# Patient Record
Sex: Female | Born: 1980 | Race: White | Hispanic: No | Marital: Single | Smoking: Current every day smoker
Health system: Southern US, Community
[De-identification: ages and names within clinical notes are randomized; demographics above are authoritative.]

## PROBLEM LIST (undated history)

## (undated) DIAGNOSIS — L509 Urticaria, unspecified: Secondary | ICD-10-CM

## (undated) DIAGNOSIS — F419 Anxiety disorder, unspecified: Secondary | ICD-10-CM

## (undated) DIAGNOSIS — L4050 Arthropathic psoriasis, unspecified: Secondary | ICD-10-CM

## (undated) HISTORY — DX: Urticaria, unspecified: L50.9

## (undated) HISTORY — PX: CYST REMOVAL HAND: SHX6279

## (undated) HISTORY — DX: Arthropathic psoriasis, unspecified: L40.50

## (undated) HISTORY — PX: WISDOM TOOTH EXTRACTION: SHX21

---

## 2000-12-19 ENCOUNTER — Other Ambulatory Visit: Admission: RE | Admit: 2000-12-19 | Discharge: 2000-12-19 | Payer: Self-pay | Admitting: *Deleted

## 2000-12-20 ENCOUNTER — Encounter (INDEPENDENT_AMBULATORY_CARE_PROVIDER_SITE_OTHER): Payer: Self-pay | Admitting: Specialist

## 2000-12-20 ENCOUNTER — Other Ambulatory Visit: Admission: RE | Admit: 2000-12-20 | Discharge: 2000-12-20 | Payer: Self-pay | Admitting: *Deleted

## 2002-03-26 ENCOUNTER — Other Ambulatory Visit: Admission: RE | Admit: 2002-03-26 | Discharge: 2002-03-26 | Payer: Self-pay | Admitting: *Deleted

## 2002-05-24 ENCOUNTER — Encounter (INDEPENDENT_AMBULATORY_CARE_PROVIDER_SITE_OTHER): Payer: Self-pay | Admitting: Specialist

## 2002-05-24 ENCOUNTER — Ambulatory Visit (HOSPITAL_COMMUNITY): Admission: RE | Admit: 2002-05-24 | Discharge: 2002-05-24 | Payer: Self-pay | Admitting: *Deleted

## 2002-10-16 ENCOUNTER — Other Ambulatory Visit: Admission: RE | Admit: 2002-10-16 | Discharge: 2002-10-16 | Payer: Self-pay | Admitting: *Deleted

## 2003-01-07 ENCOUNTER — Other Ambulatory Visit: Admission: RE | Admit: 2003-01-07 | Discharge: 2003-01-07 | Payer: Self-pay | Admitting: *Deleted

## 2003-04-03 ENCOUNTER — Other Ambulatory Visit: Admission: RE | Admit: 2003-04-03 | Discharge: 2003-04-03 | Payer: Self-pay | Admitting: *Deleted

## 2003-09-16 ENCOUNTER — Other Ambulatory Visit: Admission: RE | Admit: 2003-09-16 | Discharge: 2003-09-16 | Payer: Self-pay | Admitting: *Deleted

## 2008-04-29 ENCOUNTER — Ambulatory Visit (HOSPITAL_COMMUNITY): Admission: RE | Admit: 2008-04-29 | Discharge: 2008-04-29 | Payer: Self-pay | Admitting: Pulmonary Disease

## 2009-05-11 ENCOUNTER — Ambulatory Visit (HOSPITAL_COMMUNITY): Payer: Self-pay | Admitting: Oncology

## 2009-05-11 ENCOUNTER — Encounter (HOSPITAL_COMMUNITY): Admission: RE | Admit: 2009-05-11 | Discharge: 2009-06-09 | Payer: Self-pay | Admitting: Oncology

## 2009-06-25 ENCOUNTER — Ambulatory Visit (HOSPITAL_COMMUNITY): Payer: Self-pay | Admitting: Oncology

## 2009-06-25 ENCOUNTER — Encounter (HOSPITAL_COMMUNITY): Admission: RE | Admit: 2009-06-25 | Discharge: 2009-07-25 | Payer: Self-pay | Admitting: Oncology

## 2009-06-29 ENCOUNTER — Ambulatory Visit (HOSPITAL_COMMUNITY): Payer: Self-pay | Admitting: Oncology

## 2010-01-21 ENCOUNTER — Ambulatory Visit (HOSPITAL_COMMUNITY): Payer: Self-pay | Admitting: Oncology

## 2010-01-21 ENCOUNTER — Encounter (HOSPITAL_COMMUNITY): Admission: RE | Admit: 2010-01-21 | Discharge: 2010-02-20 | Payer: Self-pay | Admitting: Oncology

## 2010-02-08 ENCOUNTER — Encounter (INDEPENDENT_AMBULATORY_CARE_PROVIDER_SITE_OTHER): Payer: Self-pay | Admitting: *Deleted

## 2010-02-08 DIAGNOSIS — F17200 Nicotine dependence, unspecified, uncomplicated: Secondary | ICD-10-CM | POA: Insufficient documentation

## 2010-02-08 DIAGNOSIS — J4489 Other specified chronic obstructive pulmonary disease: Secondary | ICD-10-CM | POA: Insufficient documentation

## 2010-02-08 DIAGNOSIS — F1910 Other psychoactive substance abuse, uncomplicated: Secondary | ICD-10-CM | POA: Insufficient documentation

## 2010-02-08 DIAGNOSIS — G2581 Restless legs syndrome: Secondary | ICD-10-CM | POA: Insufficient documentation

## 2010-02-08 DIAGNOSIS — E559 Vitamin D deficiency, unspecified: Secondary | ICD-10-CM | POA: Insufficient documentation

## 2010-02-08 DIAGNOSIS — D069 Carcinoma in situ of cervix, unspecified: Secondary | ICD-10-CM | POA: Insufficient documentation

## 2010-02-08 DIAGNOSIS — D72829 Elevated white blood cell count, unspecified: Secondary | ICD-10-CM | POA: Insufficient documentation

## 2010-02-08 DIAGNOSIS — J449 Chronic obstructive pulmonary disease, unspecified: Secondary | ICD-10-CM | POA: Insufficient documentation

## 2010-02-08 DIAGNOSIS — F329 Major depressive disorder, single episode, unspecified: Secondary | ICD-10-CM | POA: Insufficient documentation

## 2010-02-08 DIAGNOSIS — E669 Obesity, unspecified: Secondary | ICD-10-CM | POA: Insufficient documentation

## 2010-02-22 ENCOUNTER — Encounter (HOSPITAL_COMMUNITY): Admission: RE | Admit: 2010-02-22 | Discharge: 2010-03-12 | Payer: Self-pay | Admitting: Oncology

## 2010-03-01 ENCOUNTER — Ambulatory Visit: Payer: Self-pay | Admitting: Infectious Diseases

## 2010-03-01 LAB — CONVERTED CEMR LAB
Eosinophils Absolute: 0.2 10*3/uL (ref 0.0–0.7)
Eosinophils Relative: 1 % (ref 0–5)
HCT: 42.2 % (ref 36.0–46.0)
Hemoglobin: 13.9 g/dL (ref 12.0–15.0)
Lymphocytes Relative: 28 % (ref 12–46)
Lymphs Abs: 3.2 10*3/uL (ref 0.7–4.0)
MCV: 89.2 fL (ref 78.0–100.0)
Monocytes Absolute: 0.7 10*3/uL (ref 0.1–1.0)
Platelets: 329 10*3/uL (ref 150–400)
RDW: 13.1 % (ref 11.5–15.5)
WBC: 11.6 10*3/uL — ABNORMAL HIGH (ref 4.0–10.5)

## 2010-03-05 ENCOUNTER — Encounter: Payer: Self-pay | Admitting: Infectious Diseases

## 2010-03-24 ENCOUNTER — Ambulatory Visit: Payer: Self-pay | Admitting: Infectious Diseases

## 2010-03-31 ENCOUNTER — Encounter (HOSPITAL_COMMUNITY): Admission: RE | Admit: 2010-03-31 | Payer: Self-pay | Admitting: Oncology

## 2010-03-31 ENCOUNTER — Ambulatory Visit (HOSPITAL_COMMUNITY): Admission: RE | Admit: 2010-03-31 | Discharge: 2010-03-31 | Payer: Self-pay | Admitting: Infectious Diseases

## 2010-04-19 ENCOUNTER — Ambulatory Visit: Payer: Self-pay | Admitting: Infectious Diseases

## 2010-05-11 ENCOUNTER — Ambulatory Visit: Payer: Self-pay | Admitting: Oncology

## 2010-07-02 ENCOUNTER — Ambulatory Visit: Payer: Self-pay | Admitting: Oncology

## 2010-07-06 ENCOUNTER — Encounter: Payer: Self-pay | Admitting: Infectious Diseases

## 2010-07-06 LAB — COMPREHENSIVE METABOLIC PANEL
AST: 17 U/L (ref 0–37)
Albumin: 4.2 g/dL (ref 3.5–5.2)
Alkaline Phosphatase: 76 U/L (ref 39–117)
Glucose, Bld: 87 mg/dL (ref 70–99)
Potassium: 4.4 mEq/L (ref 3.5–5.3)
Sodium: 139 mEq/L (ref 135–145)
Total Bilirubin: 0.2 mg/dL — ABNORMAL LOW (ref 0.3–1.2)
Total Protein: 6.6 g/dL (ref 6.0–8.3)

## 2010-07-06 LAB — CBC WITH DIFFERENTIAL/PLATELET
BASO%: 1 % (ref 0.0–2.0)
EOS%: 0.6 % (ref 0.0–7.0)
MCH: 29.4 pg (ref 25.1–34.0)
MCHC: 33.6 g/dL (ref 31.5–36.0)
MONO#: 0.7 10*3/uL (ref 0.1–0.9)
NEUT%: 68.2 % (ref 38.4–76.8)
RBC: 4.54 10*6/uL (ref 3.70–5.45)
RDW: 13 % (ref 11.2–14.5)
WBC: 10.1 10*3/uL (ref 3.9–10.3)
lymph#: 2.4 10*3/uL (ref 0.9–3.3)

## 2010-07-06 LAB — CHCC SMEAR

## 2010-07-09 LAB — JAK-2 V617F

## 2010-07-13 NOTE — Miscellaneous (Signed)
Summary: HIPAA Restrictions  HIPAA Restrictions   Imported By: Florinda Marker 03/01/2010 14:45:06  _____________________________________________________________________  External Attachment:    Type:   Image     Comment:   External Document

## 2010-07-13 NOTE — Assessment & Plan Note (Signed)
Summary: 3wk f/u/ok per hatcher/vs   CC:  3 week follow up.  History of Present Illness: 30 yo F obesity, HPV of the cervix, and leukocytosis since August 2010. She has been eval by heme. No specific etiology was found and her WBC has continued to be elavated.  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 Also has dog. Father with TB.  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 LAP and LAD studies, brucella Ab-.  Today c/o sinus fullness, d/c, cough, no headache. No fevers since first 8 days of signs. lymphadenopathy.  Preventive Screening-Counseling & Management  Alcohol-Tobacco     Alcohol drinks/day: 0     Smoking Status: current     Packs/Day: 0.5  Caffeine-Diet-Exercise     Caffeine use/day: sodas     Does Patient Exercise: yes     Type of exercise: water aerobics  Safety-Violence-Falls     Seat Belt Use: yes   Updated Prior Medication List: VITAMIN D3 1000 UNIT TABS (CHOLECALCIFEROL) Take 1 tablet by mouth two times a day per PCP ANTACID 420 MG CHEW (CALCIUM CARBONATE ANTACID) Take 1 tablet by mouth once a day per PCP ORTHO TRI-CYCLEN LO 0.18/0.215/0.25 MG-25 MCG TABS (NORGESTIM-ETH ESTRAD TRIPHASIC) as directed per PCP ONE-A-DAY WOMENS PRENATAL 28-0.8 & 440 MG MISC (PRENATAL VIT-FE FUM-FA-OMEGA) Take 1 tablet by mouth once a day per PCP  Current Allergies (reviewed today): No known allergies  Past History:  Past medical, surgical, family and social histories (including risk factors) reviewed, and no changes noted (except as noted below).  Past Medical History: Reviewed history from 03/01/2010 and no changes required. 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 father with TB, both parents with IBS.   Social History: Reviewed history from 03/01/2010 and no changes required. Domestic Partner Current Smoker-  1/2 ppd for 11 yrs. has quit occasionally.  Alcohol use-no  Vital Signs:  Patient profile:   31 year old female Height:      62 inches (157.48 cm) Weight:      186.0 pounds (84.55 kg) BMI:     34.14 Temp:     97.8 degrees F (36.56 degrees C) oral Pulse rate:   101 / minute BP sitting:   123 / 81  (right arm)  Vitals Entered By: Baxter Hire) (March 24, 2010 8:59 AM) CC: 3 week follow up Pain Assessment Patient in pain? no      Nutritional Status BMI of > 30 = obese Nutritional Status Detail appetite is okay per patient  Does patient need assistance? Functional Status Self care Ambulation Normal   Physical Exam  General:  well-developed, well-nourished, and well-hydrated.   Eyes:  pupils equal, pupils round, and pupils reactive to light.   Mouth:  pharynx pink and moist and no exudates.   Neck:  no masses.   Lungs:  normal respiratory effort and normal breath sounds.   Heart:  normal rate, regular rhythm, and no murmur.   Abdomen:  soft, non-tender, and normal bowel sounds.     Impression & Recommendations:  Problem # 1:  LEUKOCYTOSIS (ICD-288.60)  her w/u to date has been negative. will send her for CT chest/abd/pelvis to further eval. if this is negative, will send her to heme for BMBx.  return to  clinic 3 weeks.  Orders: Est. Patient Level III (16109) CT with Contrast (CT w/ contrast)

## 2010-07-13 NOTE — Consult Note (Signed)
Summary: [facility]  [facility]   Imported By: Florinda Marker 03/04/2010 16:18:03  _____________________________________________________________________  External Attachment:    Type:   Image     Comment:   External Document

## 2010-07-13 NOTE — Assessment & Plan Note (Signed)
Summary: 3WK F/U[MKJ]   CC:  f/u .  History of Present Illness: 30 yo F obesity, HPV of the cervix, and leukocytosis since August 2010. She has been eval by heme. No specific etiology was found and her WBC has continued to be elavated.  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 Also has dog. Father with TB.  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 LAP and LAD studies, brucella Ab-. Has had CT chest/abd/pelvis negative since last visit. Had some problems with dizziness with bending over recently. Would break out in a sweat with this as well. It has stopped over the last week since she resumed taking her VIt D pills.   Preventive Screening-Counseling & Management  Alcohol-Tobacco     Alcohol drinks/day: 0     Smoking Status: current     Packs/Day: 0.5   Updated Prior Medication List: VITAMIN D3 1000 UNIT TABS (CHOLECALCIFEROL) 2 tab at hs ORTHO TRI-CYCLEN LO 0.18/0.215/0.25 MG-25 MCG TABS (NORGESTIM-ETH ESTRAD TRIPHASIC) as directed per PCP ONE-A-DAY WOMENS PRENATAL 28-0.8 & 440 MG MISC (PRENATAL VIT-FE FUM-FA-OMEGA) Take 1 tablet by mouth once a day per PCP  Current Allergies (reviewed today): No known allergies  Past History:  Past medical, surgical, family and social histories (including risk factors) reviewed, and no changes noted (except as noted below).  Past Medical History: Reviewed history from 03/01/2010 and no changes required. 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 father with TB, both parents with IBS.   Social History: Reviewed history from 03/01/2010 and no changes required. Domestic Partner Current Smoker-  1/2 ppd for 11  yrs. has quit occasionally.  Alcohol use-no  Vital Signs:  Patient profile:   30 year old female Height:      62 inches (157.48 cm) Weight:      184.50 pounds (83.86 kg) BMI:     33.87 Temp:     98.0 degrees F (36.67 degrees C) oral Pulse rate:   102 / minute  Vitals Entered By: Starleen Arms CMA (April 19, 2010 9:28 AM) CC: f/u  Is Patient Diabetic? No Pain Assessment Patient in pain? no      Nutritional Status BMI of > 30 = obese Nutritional Status Detail nl  Does patient need assistance? Functional Status Self care Ambulation Normal   Physical Exam  General:  well-developed, well-nourished, well-hydrated, and overweight-appearing.   Eyes:  pupils equal, pupils round, and pupils reactive to light.   Mouth:  pharynx pink and moist and no exudates.   Neck:  no masses.   Lungs:  normal respiratory effort and normal breath sounds.   Heart:  normal rate, regular rhythm, and no murmur.   Abdomen:  soft, non-tender, and normal bowel sounds.     Impression & Recommendations:  Problem # 1:  LEUKOCYTOSIS (ICD-288.60)  her dx continues to be ellusive. we spoke about next steps for her. Considerations are for an indium study or referal to heme. we agreed on a heme eval, hopefillu for a bone marrow biopsy. she will return to clinic as needed   Orders: Hematology Referral (Hematology) Est. Patient Level IV (36644)  Problem # 2:  OBESITY (ICD-278.00) she has lost 1# since last visit. she is trying to loose more, she is encouraged in this.  Orders: Est. Patient Level IV (16109)  Medications Added to Medication List This Visit: 1)  Vitamin D3 1000 Unit Tabs (Cholecalciferol) .... 2 tab at hs

## 2010-07-13 NOTE — Assessment & Plan Note (Signed)
Summary: new pt  elevated wht ct   CC:  new patient - elevated wht ct.  History of Present Illness: 30 yo F obesity, HPV of the cervix, and leukocytosis (13,700) since August 2010. She has been eval by heme. No specific etiology was found and her WBC has continued to be elavated [15.6 (01-21-10)].  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 No fever or chills. States that she does get hot at night time and takes off sheets. has been having sweats as well. feels like she is always cold. wakes up with hair wet.   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 Also has dog. Father with TB.   Preventive Screening-Counseling & Management  Alcohol-Tobacco     Alcohol drinks/day: 0     Smoking Status: current     Packs/Day: 0.5  Caffeine-Diet-Exercise     Caffeine use/day: sodas  Safety-Violence-Falls     Seat Belt Use: yes   Updated Prior Medication List: VITAMIN D3 1000 UNIT TABS (CHOLECALCIFEROL) Take 1 tablet by mouth two times a day per PCP ANTACID 420 MG CHEW (CALCIUM CARBONATE ANTACID) Take 1 tablet by mouth once a day per PCP ORTHO TRI-CYCLEN LO 0.18/0.215/0.25 MG-25 MCG TABS (NORGESTIM-ETH ESTRAD TRIPHASIC) as directed per PCP ONE-A-DAY WOMENS PRENATAL 28-0.8 & 440 MG MISC (PRENATAL VIT-FE FUM-FA-OMEGA) Take 1 tablet by mouth once a day per PCP  Current Allergies (reviewed today): No known allergies  Past History:  Past Medical History: Current Problems:  RESTLESS LEG SYNDROME (ICD-333.94) UNSPECIFIED VITAMIN D DEFICIENCY (ICD-268.9) COPD (ICD-496) DEPRESSION, MILD (ICD-311) CAFFEINE EXCESS (ICD-305.90) OBESITY (ICD-278.00) CARCINOMA IN SITU, 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   Social History: Media planner Current Smoker-  1/2 ppd for 11 yrs. has quit occasionally.  Alcohol use-no  Review of Systems       BM 4x/day, believes she has IBS, passes urine  well, normal periods. wt is steady for last year. have been doing water aerobics twice a week. no lymphadenopathy, has had bilateral LE cramping for years.   Vital Signs:  Patient profile:   30 year old female Height:      62 inches (157.48 cm) Weight:      188.0 pounds (85.45 kg) BMI:     34.51 Temp:     98.6 degrees F (37.00 degrees C) oral Pulse rate:   97 / minute BP sitting:   119 / 78  (right arm)  Vitals Entered By: Baxter Hire) (March 01, 2010 11:19 AM) CC: new patient - elevated wht ct Pain Assessment Patient in pain? no      Nutritional Status BMI of > 30 = obese Nutritional Status Detail appetite is fine per patient  Does patient need assistance? Functional Status Self care Ambulation Normal   Physical Exam  General:  well-developed, well-nourished, and well-hydrated.   Eyes:  pupils equal, pupils round, and pupils reactive to light.   Mouth:  pharynx pink and moist and no exudates.   Neck:  no masses.   Lungs:  normal respiratory effort and normal breath sounds.   Heart:  normal rate, regular rhythm, and no murmur.   Abdomen:  soft, non-tender, and normal bowel sounds.   Extremities:  no edema. Neurologic:  alert & oriented X3, cranial nerves II-XII intact, and strength normal in all extremities.   Skin:  few scared areas where she has had "skin cancers" burned off.  Axillary Nodes:  no R axillary adenopathy and no L axillary adenopathy.     Impression & Recommendations:  Problem # 1:  LEUKOCYTOSIS (ICD-288.60)  Wide ddx in this pt- from non-infetious (smoking, OCPs), infectious (? brucelosis), immunologic (LAD),  hematologic. WIll check brucella Ab, leukocyte alkaline phosphatse score, flow cytometry (for LAD). If these are negative will ocnsider indium scan.  return to clinic 2-3 weeks.   Orders: Consultation Level IV [phone]) T- * Misc. Laboratory test [phone]) T-CBC w/Diff [phone])  Appended Document: Orders Update    Clinical  Lists Changes  Orders: Added new Test order of T- * Misc. Laboratory test [phone]) - Signed

## 2010-07-13 NOTE — Miscellaneous (Signed)
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) - HPV positivity in 2000, several surgical loop excisions of her cervix Added new problem of OBESITY (ICD-278.00) Added new problem of CAFFEINE EXCESS (ICD-305.90) Added new problem of DEPRESSION, MILD (ICD-311) - hx of Added new problem of COPD (ICD-496) Added new problem of UNSPECIFIED VITAMIN D DEFICIENCY (ICD-268.9) - August 2010 diagnosed Added new problem of RESTLESS LEG SYNDROME (ICD-333.94) - on Limbitrol Medications: Added new medication of VITAMIN D3 1000 UNIT TABS (CHOLECALCIFEROL) Take 1 tablet by mouth two times a day per PCP Added new medication of ANTACID 420 MG CHEW (CALCIUM CARBONATE ANTACID) Take 1 tablet by mouth once a day per PCP Added new medication of ORTHO TRI-CYCLEN LO 0.18/0.215/0.25 MG-25 MCG TABS (NORGESTIM-ETH ESTRAD TRIPHASIC) as directed per PCP Added new medication of ONE-A-DAY WOMENS PRENATAL 28-0.8 & 440 MG MISC (PRENATAL VIT-FE FUM-FA-OMEGA) Take 1 tablet by mouth once a day per PCP Observations: Added new observation of NKA: T (02/08/2010 16:27)

## 2010-08-10 NOTE — Consult Note (Signed)
Summary: [facility] New Pt. Eval  [facility] New Pt. Eval   Imported By: Florinda Marker 08/06/2010 11:42:28  _____________________________________________________________________  External Attachment:    Type:   Image     Comment:   External Document

## 2010-08-18 ENCOUNTER — Encounter: Payer: Self-pay | Admitting: *Deleted

## 2010-08-26 LAB — CBC
HCT: 40.7 % (ref 36.0–46.0)
MCHC: 34 g/dL (ref 30.0–36.0)
MCV: 88.3 fL (ref 78.0–100.0)
Platelets: 258 10*3/uL (ref 150–400)
RDW: 12.9 % (ref 11.5–15.5)

## 2010-08-26 LAB — DIFFERENTIAL
Basophils Absolute: 0.1 10*3/uL (ref 0.0–0.1)
Basophils Relative: 1 % (ref 0–1)
Eosinophils Absolute: 0.2 10*3/uL (ref 0.0–0.7)
Eosinophils Relative: 2 % (ref 0–5)
Monocytes Absolute: 0.7 10*3/uL (ref 0.1–1.0)

## 2010-08-26 LAB — SEDIMENTATION RATE: Sed Rate: 26 mm/hr — ABNORMAL HIGH (ref 0–22)

## 2010-08-27 LAB — CBC
Hemoglobin: 13 g/dL (ref 12.0–15.0)
MCH: 29.8 pg (ref 26.0–34.0)
MCHC: 33.6 g/dL (ref 30.0–36.0)
Platelets: 292 10*3/uL (ref 150–400)
RDW: 13 % (ref 11.5–15.5)

## 2010-08-27 LAB — DIFFERENTIAL
Basophils Absolute: 0 10*3/uL (ref 0.0–0.1)
Basophils Relative: 0 % (ref 0–1)
Eosinophils Absolute: 0.2 10*3/uL (ref 0.0–0.7)
Monocytes Relative: 8 % (ref 3–12)
Neutrophils Relative %: 57 % (ref 43–77)

## 2010-08-29 LAB — DIFFERENTIAL
Basophils Absolute: 0.1 10*3/uL (ref 0.0–0.1)
Basophils Relative: 1 % (ref 0–1)
Lymphocytes Relative: 27 % (ref 12–46)
Monocytes Relative: 6 % (ref 3–12)
Neutro Abs: 8.7 10*3/uL — ABNORMAL HIGH (ref 1.7–7.7)
Neutrophils Relative %: 65 % (ref 43–77)

## 2010-08-29 LAB — CBC
MCHC: 33.5 g/dL (ref 30.0–36.0)
RBC: 4.55 MIL/uL (ref 3.87–5.11)

## 2010-09-15 LAB — DIFFERENTIAL
Basophils Relative: 1 % (ref 0–1)
Eosinophils Absolute: 0.2 10*3/uL (ref 0.0–0.7)
Neutrophils Relative %: 66 % (ref 43–77)

## 2010-09-15 LAB — COMPREHENSIVE METABOLIC PANEL
ALT: 22 U/L (ref 0–35)
Alkaline Phosphatase: 81 U/L (ref 39–117)
CO2: 30 mEq/L (ref 19–32)
GFR calc non Af Amer: >60 mL/min (ref >60)
Glucose, Bld: 104 mg/dL — ABNORMAL HIGH (ref 70–99)
Potassium: 3.7 mEq/L (ref 3.5–5.1)
Sodium: 138 mEq/L (ref 135–145)

## 2010-09-15 LAB — CBC
Hemoglobin: 13.5 g/dL (ref 12.0–15.0)
MCHC: 34.6 g/dL (ref 30.0–36.0)
RBC: 4.44 MIL/uL (ref 3.87–5.11)

## 2010-09-15 LAB — IRON AND TIBC
Iron: 43 ug/dL (ref 42–135)
TIBC: 314 ug/dL (ref 250–470)

## 2010-10-05 ENCOUNTER — Encounter (HOSPITAL_BASED_OUTPATIENT_CLINIC_OR_DEPARTMENT_OTHER): Payer: 59 | Admitting: Oncology

## 2010-10-05 ENCOUNTER — Other Ambulatory Visit: Payer: Self-pay | Admitting: Oncology

## 2010-10-05 DIAGNOSIS — D72829 Elevated white blood cell count, unspecified: Secondary | ICD-10-CM

## 2010-10-05 LAB — CBC WITH DIFFERENTIAL/PLATELET
Basophils Absolute: 0.1 10*3/uL (ref 0.0–0.1)
Eosinophils Absolute: 0.1 10*3/uL (ref 0.0–0.5)
HGB: 13.9 g/dL (ref 11.6–15.9)
MCV: 88.6 fL (ref 79.5–101.0)
MONO%: 8 % (ref 0.0–14.0)
NEUT#: 6.9 10*3/uL — ABNORMAL HIGH (ref 1.5–6.5)
Platelets: 278 10*3/uL (ref 145–400)
RDW: 12.9 % (ref 11.2–14.5)

## 2010-10-29 NOTE — Op Note (Signed)
NAME:  Connie Jensen, Connie Jensen                        ACCOUNT NO.:  [id number]   MEDICAL RECORD NO.:  [id number]                   PATIENT TYPE:  AMB   LOCATION:  DAY                                  FACILITY:  [facility]   PHYSICIAN:  Pershing Cox, M.D.            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   PROCEDURE:  1. Colposcopic directed biopsies of the vulva x 3.  2. Laser conization.   ANESTHESIA:  General endotracheal via LMA mask and paracervical block.   SURGEON:  Pershing Cox, M.D.   INDICATIONS FOR PROCEDURE:  This patient is 30 years old.  She has a history  of cervical dysplasia and underwent loop conization in August 2002.  This  loop specimen showed mild dysplasia focally involving the endocervical  margin on the anterior half of the cone.  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  Colposcopic examination showed white epithelium extending into  the endocervical canal.  There was an area of white epithelium encircling  the exocervix as well.  At the time of this colposcopy, she was found to  have lesions on her vulva.  She was not willing to have a biopsy of her  vulva, and so we made a plan to come to the operating room to do vulvar  biopsies, frozen section on the biopsy, and then wide excision as necessary.  In addition, I recommended a laser conization because of her poor tolerance  of the LEEP procedure in the office.  In discussing our plans with the  pathologist this morning, there was grave concern that we might over-call or  under-call the vulvar biopsies  by frozen section, and so a decision was made  to do vulvar biopsies and the cone, deferring any therapy of the vulvar  lesions to the results of these biopsies.  I discussed this with the patient  prior to surgery, and she was agreeable.   OPERATIVE FINDINGS:  On colposcopic examination of the vulva, there was a  discrete white area on the left lower labia majora.  There was a broad white  area on the perineal body that was biopsied twice.  The cone was as  described above.   DESCRIPTION OF PROCEDURE:  [name] was brought to the operating room  with an IV in place.  She received 1 g of Ancef in the holding area.  Supine  on the OR table, IV sedation was administered.  She then underwent the  placement of LMA for general anesthesia.  The colposcope was used to  visualize her  vulva.  Acetic acid was applied for approximately two minutes.  The areas of white epithelium were easily noted.  Using a 3 mm dermal punch,  these areas were biopsied once on the left lower labia majora and twice on  the perineal body.  The cautery was used to obtain hemostasis at the base of  these biopsies.   The patient's cervix was visualized using a blackened speculum.  This was  attached to suction for our floundering the procedure.  The laser was set on  #9 super-pulse and under direct visualization, the cone was outlined using  intermittent dot system.  Then a dilute vasopressin solution 10 in 100 was  injected into the paracervical tissues using a total volume of 20 cc of this  solution.  It was well-tolerated without hypertension.  Again, the laser set  on a setting of 9 super-pulse was used to connect the dots, and then the  laser was used to perform a cone biopsy.  A very small, narrow cone was  performed using the blackened instruments until the base of the cone was  reached.  At this point, an Allis clamp was placed, and the base of the cone  was brought to the endocervix.  Once the endocervix  was reached, sharp  scissors were used to excise the base of the cone, and the cone was marked  at the 12 o'clock position.  Next, endocervical curette was used to curette  the upper endocervical canal, and this was collected onto Telfa.  The laser  was then completely defocused and using the same setting, the base of the  cone was lasered to coagulate any potential bleeders.  Monsel's was applied  to the cone.  The patient was extubated and taken to the recovery room in  good condition.  Specimens include vulvar biopsy in the perineum, two  biopsies left lower labia majora, one biopsy laser cone, and endocervical  curettings.   It should be noted that throughout this procedure, the patient's cervix was  manipulated consistently by using blackened instruments to rotate the cervix  and allow me to make the smallest possible cone.  Halfway through the  procedure, we replaced the blackened speculum with a weighted vaginal  speculum and the anterior blackened retractor so that I could move around  more easily with the small hooks and diverting devices.                                               Pershing Cox, M.D.    MAJ/MEDQ  D:  05/24/2002  T:  05/24/2002  Job:  [id number]

## 2010-10-29 NOTE — Procedures (Signed)
Connie Jensen, Connie Jensen              ACCOUNT NO.:  [id number]   MEDICAL RECORD NO.:  [id number]          PATIENT TYPE:  OUT   LOCATION:  RESP                          FACILITY:  [facility]   PHYSICIAN:  Edward L. Juanetta Gosling, M.D.DATE OF BIRTH:  1980/10/06   DATE OF PROCEDURE:  DATE OF DISCHARGE:  04/29/2008                            PULMONARY FUNCTION TEST   1. Spirometry shows no ventilatory defect and no definite airflow      obstruction.  2. Lung volumes are normal.  3. DLCO is normal.  4. There is no definite cause of shortness of breath seen on this      study, although all of the numbers are about 80% of predicted,      which is the lower limit of normal.      Edward L. Juanetta Gosling, M.D.  Electronically Signed     ELH/MEDQ  D:  05/02/2008  T:  05/02/2008  Job:  [id number]

## 2011-01-14 IMAGING — CR DG CHEST 2V
2 series · 2 of 2 positions shown · non-contrast
Comparison: None.

CLINICAL DATA: Leukocytosis, smoker.

CHEST - 2 VIEW

[view not recorded (1 of 2)]
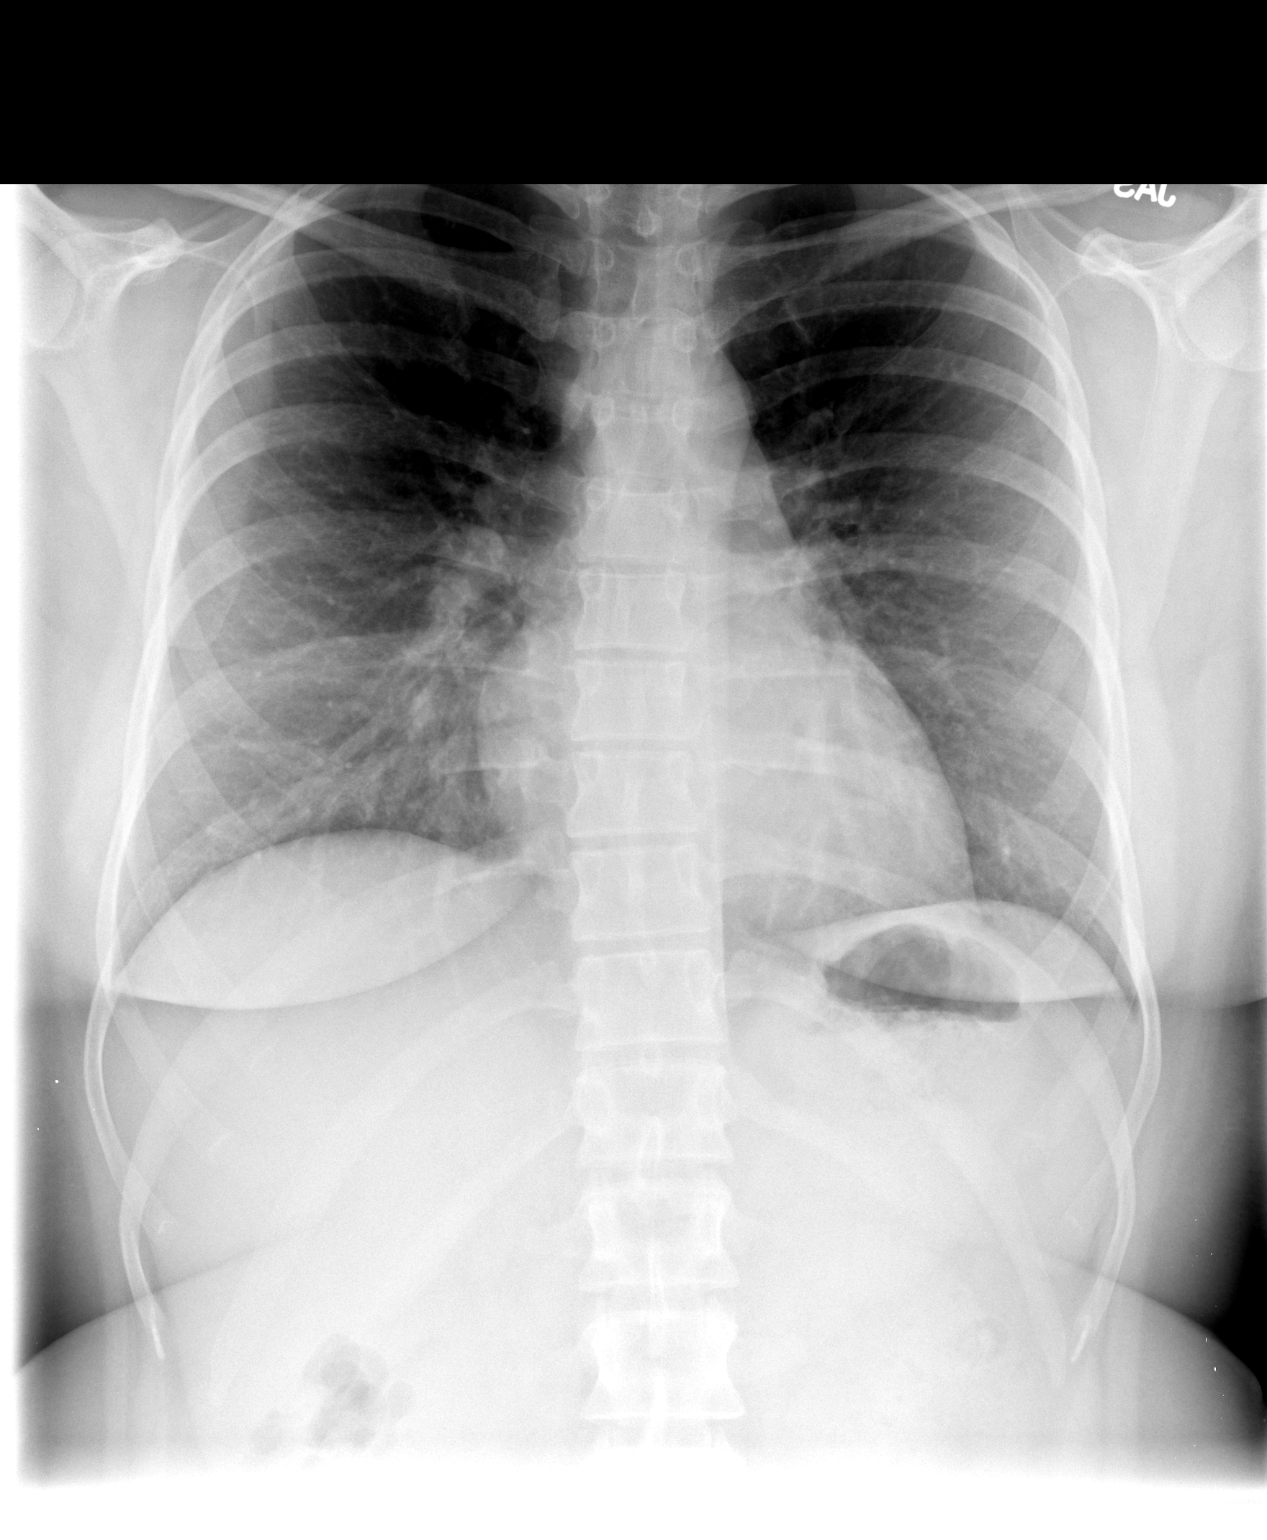

[view not recorded (2 of 2)]
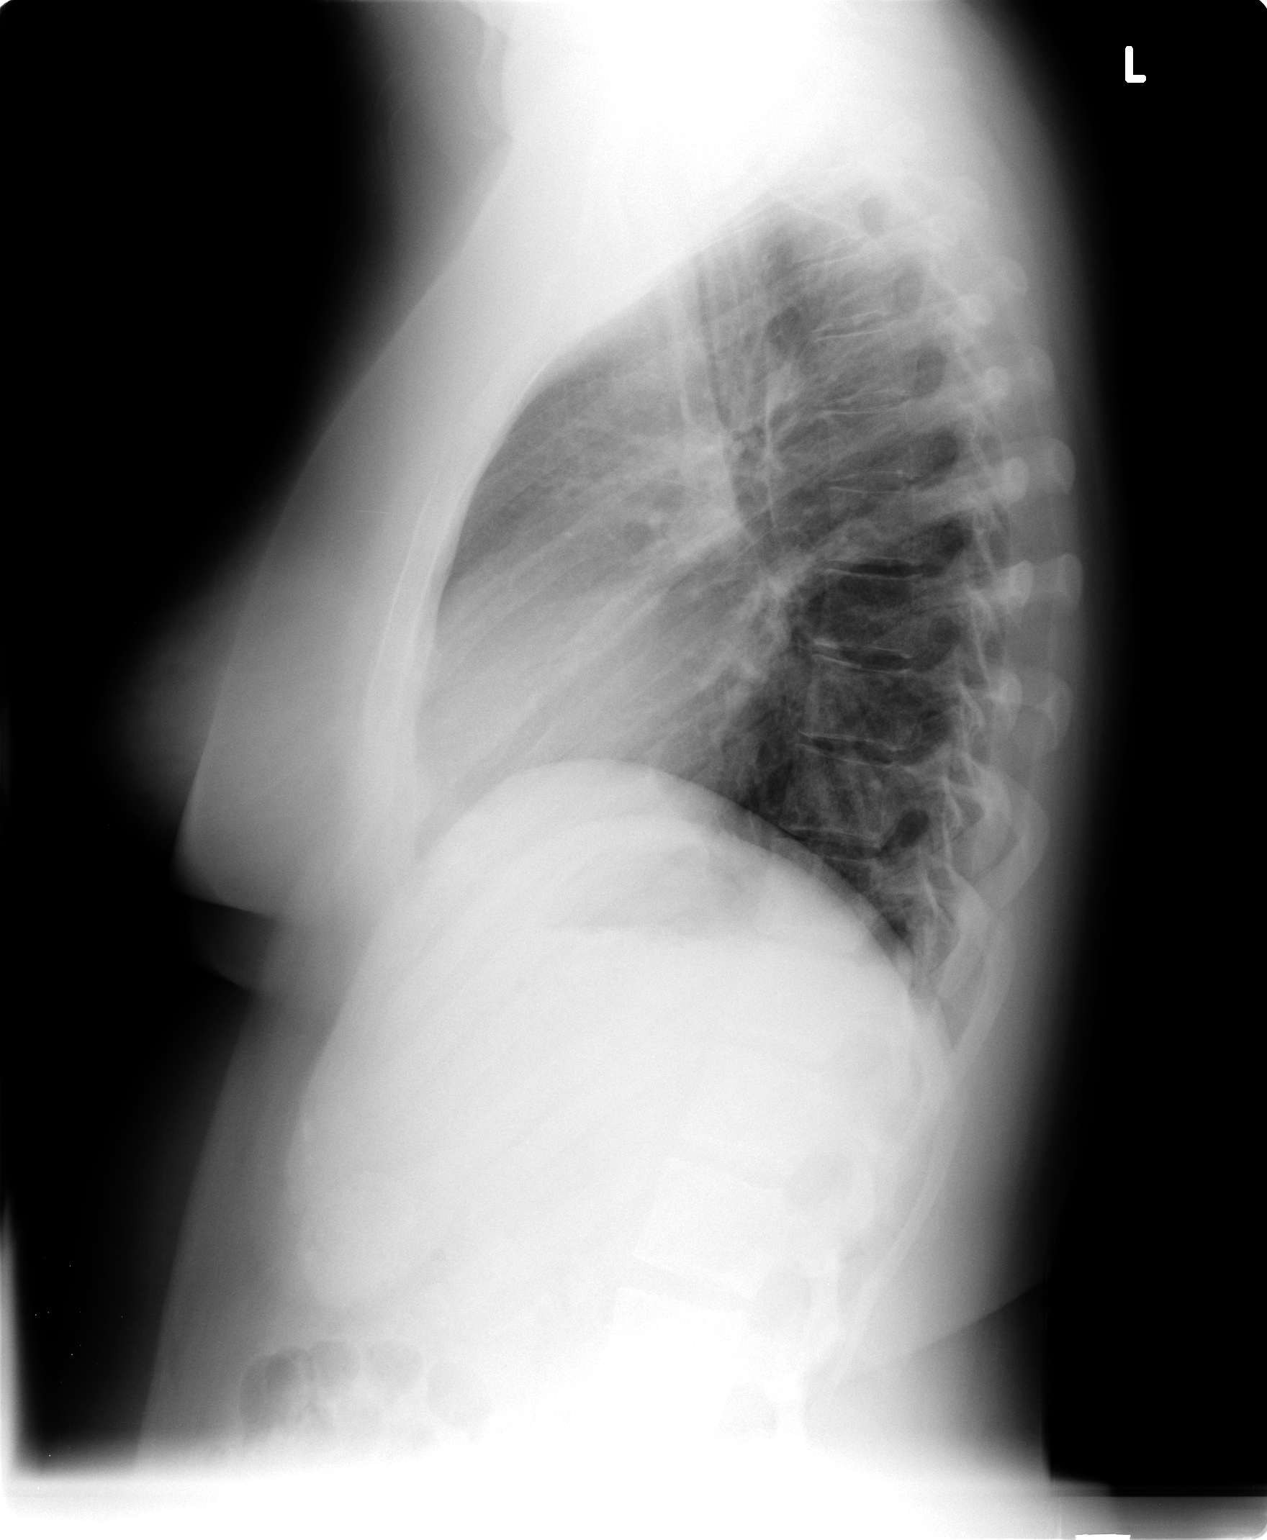

[2 of 2 positions shown; findings below may reference images not displayed]

FINDINGS: Trachea is midline.  Heart size normal.  Lungs are clear.
No pleural fluid.
IMPRESSION: No acute findings.

## 2011-01-27 ENCOUNTER — Encounter: Payer: Self-pay | Admitting: Orthopedic Surgery

## 2011-01-27 ENCOUNTER — Ambulatory Visit (INDEPENDENT_AMBULATORY_CARE_PROVIDER_SITE_OTHER): Payer: 59 | Admitting: Orthopedic Surgery

## 2011-01-27 VITALS — HR 108 | Ht 62.0 in | Wt 194.0 lb

## 2011-01-27 DIAGNOSIS — M549 Dorsalgia, unspecified: Secondary | ICD-10-CM

## 2011-01-27 DIAGNOSIS — S76219A Strain of adductor muscle, fascia and tendon of unspecified thigh, initial encounter: Secondary | ICD-10-CM | POA: Insufficient documentation

## 2011-01-27 DIAGNOSIS — IMO0002 Reserved for concepts with insufficient information to code with codable children: Secondary | ICD-10-CM

## 2011-01-27 MED ORDER — IBUPROFEN 800 MG PO TABS: 800.0000 mg | ORAL_TABLET | Freq: Three times a day (TID) | ORAL | Status: AC | PRN

## 2011-01-27 NOTE — Progress Notes (Signed)
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  Approximately 3 weeks ago the patient reached over to pick up an empty garbage can and start having left-sided lower back pain.  She reports catching in the lower back with occasional tingling in the LEFT calf but denies any bowel or bladder dysfunction.  She was placed on Cipro to treat for possible kidney infection  The pain in her groin is rated 4/10 pain in her back is rated 8/10 at its described as throbbing intermittent.  She says it is worse when she is moving her fingers making it better.    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  She has no major medical problems she had surgery in 2004 (loop)  PFSH: (1) Stated above  Physical Exam(12) GENERAL: normal development   CDV: pulses are normal   Skin: normal  Lymph: nodes were not palpable/normal  Psychiatric: awake, alert and oriented  Neuro: normal sensation  MSK She ambulates without assistive device  She has tenderness in the LEFT quadrant of her lower back no pain in the midline or pain on the RIGHT she flexes to mid tibia level and has pain with extension she has a negative straight leg raise bilaterally.   1As far as the groin goes she has pain with abduction pain with internal and external rotation no pain with flexion. 2 Muscle tone is normal 3 Range of motion is full 4 Hip is stable bilaterally 5 There is no tenderness to the thigh but tenderness in the  adductors  Assessment:   #1 she has extreme groin  #2 she has a recent onset lower back pain  I placed her in therapy added ibuprofen continue her Vicodin and Robaxin.  When she comes back in 4 weeks if she still having back pain recommend x-ray.

## 2011-02-24 ENCOUNTER — Ambulatory Visit: Payer: 59 | Admitting: Orthopedic Surgery

## 2011-08-17 ENCOUNTER — Encounter (INDEPENDENT_AMBULATORY_CARE_PROVIDER_SITE_OTHER): Payer: Self-pay | Admitting: *Deleted

## 2011-08-22 ENCOUNTER — Ambulatory Visit (INDEPENDENT_AMBULATORY_CARE_PROVIDER_SITE_OTHER): Payer: BC Managed Care – PPO | Admitting: Internal Medicine

## 2011-08-22 ENCOUNTER — Encounter (INDEPENDENT_AMBULATORY_CARE_PROVIDER_SITE_OTHER): Payer: Self-pay | Admitting: Internal Medicine

## 2011-08-22 VITALS — BP 132/70 | HR 72 | Temp 98.7°F | Ht 62.0 in | Wt 193.7 lb

## 2011-08-22 DIAGNOSIS — K589 Irritable bowel syndrome without diarrhea: Secondary | ICD-10-CM

## 2011-08-22 MED ORDER — DICYCLOMINE HCL 10 MG PO CAPS: 10.0000 mg | ORAL_CAPSULE | Freq: Four times a day (QID) | ORAL | Status: DC

## 2011-08-22 NOTE — Patient Instructions (Signed)
Bentyl 10mg  QID. PR in 2 weeks. Stool diary. OV in 3 months

## 2011-08-22 NOTE — Progress Notes (Signed)
Subjective:     Patient ID: Connie Jensen, female   DOB: 05-24-1981, 31 y.o.   MRN: [id number]  HPI Connie Jensen is a 31 yr old female referred to our office by Dr. Margo Aye  She tells me she has IBS x 10 yrs. She says she has urgency. She has 4-6 BMS a day.  Stools are very watery. She will have one good, formed stool in the am and the rest will be loose. She tells me her mother and father have the same symptoms. Appetite is good. Weight loss intentional.  She will have abdominal crampy feeling when she needs to have a BM. No melena or bright red rectal bleeding. She avoids [place] and [place] foods.  Review of Systems See hpi  Current Outpatient Prescriptions on File Prior to Visit  Medication Sig Dispense Refill  . Cholecalciferol (VITAMIN D3) 1000 UNITS CAPS Take 2 tablets by mouth at bedtime.        Lorita Officer Triphasic (ORTHO TRI-CYCLEN LO) 0.18/0.215/0.25 MG-25 MCG TABS Take by mouth as directed. As directed per PCP       . HYDROcodone-acetaminophen (VICODIN) 5-500 MG per tablet Take 1 tablet by mouth every 4 (four) hours as needed.        . methocarbamol (ROBAXIN) 750 MG tablet Take 750 mg by mouth 4 (four) times daily.        . Prenatal Vit-Fe Fum-FA-Omega (ONE-A-DAY WOMENS PRENATAL) 28-0.8 & 440 MG MISC Take 1 tablet by mouth daily. Per PCP        .History reviewed. No pertinent past medical history. History reviewed. No pertinent past surgical history. History   Social History  . Marital Status: Single    Spouse Name: N/A    Number of Children: N/A  . Years of Education: N/A   Occupational History  . Not on file.   Social History Main Topics  . Smoking status: Current Everyday Smoker -- 1.0 packs/day    Types: Cigarettes  . Smokeless tobacco: Not on file  . Alcohol Use: No  . Drug Use: No  . Sexually Active: Not on file   Other Topics Concern  . Not on file   Social History Narrative  . No narrative on file   Family Status  Relation Status Death Age  .  Mother Alive     good health  . Father Alive     back.  . Brother Alive     good health   No Known Allergies     Objective:   Physical Exam Filed Vitals:   08/22/11 1137  Height: 5\' 2"  (1.575 m)  Weight: 193 lb 11.2 oz (87.862 kg)   Alert and oriented. Skin warm and dry. Oral mucosa is moist.   . Sclera anicteric, conjunctivae is pink. Thyroid not enlarged. No cervical lymphadenopathy. Lungs clear. Heart regular rate and rhythm.  Abdomen is soft. Bowel sounds are positive. No hepatomegaly. No abdominal masses felt. No tenderness.  No edema to lower extremities. Patient is alert and oriented.      Assessment:   Probable irritable bowel. No alarm symptoms.    Plan:    Bentyl 10mg  TID. Fiber tabs 4 gm. OV in 3 months. Stool diary.

## 2011-09-23 ENCOUNTER — Other Ambulatory Visit (INDEPENDENT_AMBULATORY_CARE_PROVIDER_SITE_OTHER): Payer: Self-pay | Admitting: Internal Medicine

## 2011-11-21 ENCOUNTER — Ambulatory Visit (INDEPENDENT_AMBULATORY_CARE_PROVIDER_SITE_OTHER): Payer: BC Managed Care – PPO | Admitting: Internal Medicine

## 2011-11-21 ENCOUNTER — Encounter (INDEPENDENT_AMBULATORY_CARE_PROVIDER_SITE_OTHER): Payer: Self-pay | Admitting: Internal Medicine

## 2011-11-21 VITALS — BP 122/84 | HR 84 | Temp 98.9°F | Ht 62.0 in | Wt 191.5 lb

## 2011-11-21 DIAGNOSIS — K589 Irritable bowel syndrome without diarrhea: Secondary | ICD-10-CM

## 2011-11-21 MED ORDER — DICYCLOMINE HCL 10 MG PO CAPS: 10.0000 mg | ORAL_CAPSULE | Freq: Three times a day (TID) | ORAL | Status: DC

## 2011-11-21 NOTE — Patient Instructions (Signed)
Continue Dicyclomine.  Follow up in 6 months.

## 2011-11-21 NOTE — Progress Notes (Signed)
Subjective:     Patient ID: Connie Jensen, female   DOB: 11-Oct-1980, 31 y.o.   MRN: [id number]  HPI [name] is a 31 yr old female presenting today for f/u.  She tells me that her stool urgency is much better. She is now having 2 stools a day instead of 4-6.  No abdominal pain. She denies urgency.  Her stools are formed. Appetite is good. No weight loss. No crampy abdominal pain. No melena or bright red rectal bleeding. She was last seen 3 months ago for IBS greater than 10 yrs. She had urgency and 4-6 stools a day.  Since starting Dicyclomine her symptoms have resolved.                  Review of Systems See hpi Current Outpatient Prescriptions  Medication Sig Dispense Refill  . Cholecalciferol (VITAMIN D3) 1000 UNITS CAPS Take 2 tablets by mouth at bedtime.        Lorita Officer Triphasic (ORTHO TRI-CYCLEN LO) 0.18/0.215/0.25 MG-25 MCG TABS Take by mouth as directed. As directed per PCP       . dicyclomine (BENTYL) 10 MG capsule TAKE 1 CAPSULE BY MOUTH 4 TIMES A DAY  120 capsule  0  . HYDROcodone-acetaminophen (VICODIN) 5-500 MG per tablet Take 1 tablet by mouth every 4 (four) hours as needed.        . methocarbamol (ROBAXIN) 750 MG tablet Take 750 mg by mouth 4 (four) times daily.        . Prenatal Vit-Fe Fum-FA-Omega (ONE-A-DAY WOMENS PRENATAL) 28-0.8 & 440 MG MISC Take 1 tablet by mouth daily. Per PCP        History reviewed. No pertinent past medical history. History reviewed. No pertinent past surgical history. History reviewed. No pertinent past surgical history. .soc History   Social History  . Marital Status: Single    Spouse Name: N/A    Number of Children: N/A  . Years of Education: N/A   Occupational History  . Not on file.   Social History Main Topics  . Smoking status: Current Everyday Smoker -- 1.0 packs/day    Types: Cigarettes  . Smokeless tobacco: Not on file  . Alcohol Use: No  . Drug Use: No  . Sexually Active: Not on file   Other Topics  Concern  . Not on file   Social History Narrative  . No narrative on file   Family Status  Relation Status Death Age  . Mother Alive     good health  . Father Alive     back.  . Brother Alive     good health       Objective:   Physical Exam Filed Vitals:   11/21/11 1048  Height: 5\' 2"  (1.575 m)  Weight: 191 lb 8 oz (86.864 kg)  Alert and oriented. Skin warm and dry. Oral mucosa is moist.   . Sclera anicteric, conjunctivae is pink. Thyroid not enlarged. No cervical lymphadenopathy. Lungs clear. Heart regular rate and rhythm.  Abdomen is soft. Bowel sounds are positive. No hepatomegaly. No abdominal masses felt. No tenderness.  No edema to lower extremities.       Assessment:     IBS which has now resolved with Dicyclomine.    Plan:   Rx for Dicyclomine eprescribed to pharmacy.  May try taking twice a day instead of three times a day. Continue fiber in diet. OV in 6 months

## 2012-05-16 ENCOUNTER — Encounter (INDEPENDENT_AMBULATORY_CARE_PROVIDER_SITE_OTHER): Payer: Self-pay | Admitting: *Deleted

## 2012-05-30 ENCOUNTER — Ambulatory Visit (INDEPENDENT_AMBULATORY_CARE_PROVIDER_SITE_OTHER): Payer: BC Managed Care – PPO | Admitting: Internal Medicine

## 2014-06-25 ENCOUNTER — Ambulatory Visit (HOSPITAL_COMMUNITY)
Admission: RE | Admit: 2014-06-25 | Discharge: 2014-06-25 | Disposition: A | Discharge status: Home / Self Care | Payer: 59 | Source: Ambulatory Visit | Attending: Internal Medicine | Admitting: Internal Medicine

## 2014-06-25 ENCOUNTER — Other Ambulatory Visit (HOSPITAL_COMMUNITY): Payer: Self-pay | Admitting: Internal Medicine

## 2014-06-25 DIAGNOSIS — W19XXXA Unspecified fall, initial encounter: Secondary | ICD-10-CM

## 2014-06-25 DIAGNOSIS — M25531 Pain in right wrist: Secondary | ICD-10-CM | POA: Insufficient documentation

## 2014-09-27 ENCOUNTER — Emergency Department (HOSPITAL_COMMUNITY): Payer: 59

## 2014-09-27 ENCOUNTER — Emergency Department (HOSPITAL_COMMUNITY)
Admission: EM | Admit: 2014-09-27 | Discharge: 2014-09-27 | Disposition: A | Discharge status: Home / Self Care | Payer: 59 | Attending: Emergency Medicine | Admitting: Emergency Medicine

## 2014-09-27 ENCOUNTER — Encounter (HOSPITAL_COMMUNITY): Payer: Self-pay | Admitting: Cardiology

## 2014-09-27 DIAGNOSIS — R109 Unspecified abdominal pain: Secondary | ICD-10-CM | POA: Diagnosis present

## 2014-09-27 DIAGNOSIS — Z3202 Encounter for pregnancy test, result negative: Secondary | ICD-10-CM | POA: Insufficient documentation

## 2014-09-27 DIAGNOSIS — R1031 Right lower quadrant pain: Secondary | ICD-10-CM

## 2014-09-27 DIAGNOSIS — Z79899 Other long term (current) drug therapy: Secondary | ICD-10-CM | POA: Insufficient documentation

## 2014-09-27 DIAGNOSIS — Z720 Tobacco use: Secondary | ICD-10-CM | POA: Diagnosis not present

## 2014-09-27 DIAGNOSIS — R1033 Periumbilical pain: Secondary | ICD-10-CM | POA: Diagnosis not present

## 2014-09-27 DIAGNOSIS — Z792 Long term (current) use of antibiotics: Secondary | ICD-10-CM | POA: Diagnosis not present

## 2014-09-27 LAB — URINALYSIS, ROUTINE W REFLEX MICROSCOPIC
BILIRUBIN URINE: NEGATIVE
Glucose, UA: NEGATIVE mg/dL
HGB URINE DIPSTICK: NEGATIVE
Ketones, ur: NEGATIVE mg/dL
Leukocytes, UA: NEGATIVE
Nitrite: NEGATIVE
Protein, ur: NEGATIVE mg/dL
Specific Gravity, Urine: 1.015 (ref 1.005–1.030)
UROBILINOGEN UA: 0.2 mg/dL (ref 0.0–1.0)
pH: 6.5 (ref 5.0–8.0)

## 2014-09-27 LAB — CBC WITH DIFFERENTIAL/PLATELET
Basophils Absolute: 0.1 10*3/uL (ref 0.0–0.1)
Basophils Relative: 0 % (ref 0–1)
Eosinophils Absolute: 0.2 10*3/uL (ref 0.0–0.7)
Eosinophils Relative: 1 % (ref 0–5)
HCT: 41.2 % (ref 36.0–46.0)
Hemoglobin: 13.6 g/dL (ref 12.0–15.0)
Lymphocytes Relative: 15 % (ref 12–46)
Lymphs Abs: 2.5 10*3/uL (ref 0.7–4.0)
MCH: 30.7 pg (ref 26.0–34.0)
MCHC: 33 g/dL (ref 30.0–36.0)
MCV: 93 fL (ref 78.0–100.0)
Monocytes Absolute: 1.1 10*3/uL — ABNORMAL HIGH (ref 0.1–1.0)
Monocytes Relative: 6 % (ref 3–12)
Neutro Abs: 13.3 10*3/uL — ABNORMAL HIGH (ref 1.7–7.7)
Neutrophils Relative %: 78 % — ABNORMAL HIGH (ref 43–77)
Platelets: 328 10*3/uL (ref 150–400)
RBC: 4.43 MIL/uL (ref 3.87–5.11)
RDW: 13.4 % (ref 11.5–15.5)
WBC: 17.1 10*3/uL — ABNORMAL HIGH (ref 4.0–10.5)

## 2014-09-27 LAB — COMPREHENSIVE METABOLIC PANEL
ALT: 40 U/L — ABNORMAL HIGH (ref 0–35)
AST: 18 U/L (ref 0–37)
Albumin: 3.7 g/dL (ref 3.5–5.2)
Alkaline Phosphatase: 68 U/L (ref 39–117)
Anion gap: 8 (ref 5–15)
BUN: 7 mg/dL (ref 6–23)
CO2: 24 mmol/L (ref 19–32)
Calcium: 8.9 mg/dL (ref 8.4–10.5)
Chloride: 104 mmol/L (ref 96–112)
Creatinine, Ser: 0.75 mg/dL (ref 0.50–1.10)
GFR calc Af Amer: >90 mL/min (ref >90)
GFR calc non Af Amer: >90 mL/min (ref >90)
Glucose, Bld: 95 mg/dL (ref 70–99)
Potassium: 4.3 mmol/L (ref 3.5–5.1)
Sodium: 136 mmol/L (ref 135–145)
Total Bilirubin: 0.5 mg/dL (ref 0.3–1.2)
Total Protein: 6.9 g/dL (ref 6.0–8.3)

## 2014-09-27 LAB — LIPASE, BLOOD: Lipase: 18 U/L (ref 11–59)

## 2014-09-27 LAB — PREGNANCY, URINE: PREG TEST UR: NEGATIVE

## 2014-09-27 MED ORDER — MORPHINE SULFATE 2 MG/ML IJ SOLN
2.0000 mg | Freq: Once | INTRAMUSCULAR | Status: AC
  Administered 2014-09-27: 2 mg via INTRAVENOUS
  Filled 2014-09-27: qty 1

## 2014-09-27 MED ORDER — HYDROCODONE-ACETAMINOPHEN 5-325 MG PO TABS: ORAL_TABLET | ORAL | Status: DC

## 2014-09-27 MED ORDER — IOHEXOL 300 MG/ML  SOLN
100.0000 mL | Freq: Once | INTRAMUSCULAR | Status: AC | PRN
  Administered 2014-09-27: 100 mL via INTRAVENOUS

## 2014-09-27 MED ORDER — SODIUM CHLORIDE 0.9 % IV BOLUS (SEPSIS)
500.0000 mL | Freq: Once | INTRAVENOUS | Status: AC
  Administered 2014-09-27: 500 mL via INTRAVENOUS

## 2014-09-27 MED ORDER — ONDANSETRON HCL 4 MG PO TABS: 4.0000 mg | ORAL_TABLET | Freq: Four times a day (QID) | ORAL | Status: DC

## 2014-09-27 MED ORDER — ONDANSETRON HCL 4 MG/2ML IJ SOLN
4.0000 mg | Freq: Once | INTRAMUSCULAR | Status: AC
  Administered 2014-09-27: 4 mg via INTRAVENOUS
  Filled 2014-09-27: qty 2

## 2014-09-27 NOTE — ED Provider Notes (Signed)
CSN: [id number]     Arrival date & time 09/27/14  2025 History   First MD Initiated Contact with Patient 09/27/14 [phone]     Chief Complaint  Patient presents with  . Abdominal Pain     (Consider location/radiation/quality/duration/timing/severity/associated sxs/prior Treatment) HPI   Connie Jensen is a 34 y.o. female who presents to the Emergency Department complaining of right sided abdominal pain that began suddenly at 4:00 am this morning.  She describes the pain as dull with waxing and waning sharp pains that radiate to her right rib area.  She took Pepto-Bismol without relief.  She also reports loss of appetite and mild nausea, but denies vomiting.  She has h/o IBS, but states the pain is not similar.  Nothing makes the pain better.  She denies fever, bowel changes, chest pain, urinary symptoms, and shortness of breath.  Pt reports eating a heavy meal last evening.   History reviewed. No pertinent past medical history. History reviewed. No pertinent past surgical history. History reviewed. No pertinent family history. History  Substance Use Topics  . Smoking status: Current Every Day Smoker -- 1.00 packs/day    Types: Cigarettes  . Smokeless tobacco: Not on file  . Alcohol Use: No   OB History    No data available     Review of Systems  Constitutional: Negative for fever, chills and appetite change.  Respiratory: Negative for shortness of breath.   Cardiovascular: Negative for chest pain.  Gastrointestinal: Positive for nausea and abdominal pain. Negative for vomiting, diarrhea, constipation and blood in stool.  Genitourinary: Negative for dysuria, flank pain, decreased urine volume and difficulty urinating.  Musculoskeletal: Negative for back pain.  Skin: Negative for color change and rash.  Neurological: Negative for dizziness, weakness and numbness.  Hematological: Negative for adenopathy.  All other systems reviewed and are negative.     Allergies  Review of  patient's allergies indicates no known allergies.  Home Medications   Prior to Admission medications   Medication Sig Start Date End Date Taking? Authorizing Provider  fexofenadine (ALLEGRA) 180 MG tablet Take 180 mg by mouth daily as needed for allergies.   Yes Historical Provider, MD  folic acid (FOLVITE) 1 MG tablet Take 1 mg by mouth daily.   Yes Historical Provider, MD  methotrexate (RHEUMATREX) 2.5 MG tablet Take 5 tablets by mouth once a week. On Wednesday. 09/03/14  Yes Historical Provider, MD  naproxen sodium (ANAPROX) 220 MG tablet Take 220 mg by mouth daily as needed (headache).   Yes Historical Provider, MD  Norgestim-Eth Estrad Triphasic (ORTHO TRI-CYCLEN LO) 0.18/0.215/0.25 MG-25 MCG TABS Take by mouth as directed. As directed per PCP    Yes Historical Provider, MD  zolpidem (AMBIEN) 5 MG tablet Take 5 mg by mouth at bedtime.   Yes Historical Provider, MD  dicyclomine (BENTYL) 10 MG capsule TAKE 1 CAPSULE BY MOUTH 4 TIMES A DAY Patient not taking: Reported on 09/27/2014 09/23/11   Butch Penny, NP  dicyclomine (BENTYL) 10 MG capsule Take 1 capsule (10 mg total) by mouth 3 (three) times daily. 11/21/11 11/20/12  Terri L Setzer, NP   BP 140/81 mmHg  Pulse 90  Temp(Src) 97.8 F (36.6 C) (Oral)  Resp 18  Ht 5\' 2"  (1.575 m)  Wt 192 lb (87.091 kg)  BMI 35.11 kg/m2  SpO2 100%  LMP 09/06/2014 Physical Exam  Constitutional: She is oriented to person, place, and time. She appears well-developed and well-nourished. No distress.  HENT:  Head: Normocephalic  and atraumatic.  Mouth/Throat: Oropharynx is clear and moist.  Cardiovascular: Normal rate, regular rhythm, normal heart sounds and intact distal pulses.   No murmur heard. Pulmonary/Chest: Effort normal and breath sounds normal. No respiratory distress.  Abdominal: Soft. Normal appearance and bowel sounds are normal. She exhibits no distension and no mass. There is tenderness in the right lower quadrant and periumbilical area.  There is no rigidity, no rebound, no guarding, no CVA tenderness and no tenderness at McBurney's point.  Musculoskeletal: Normal range of motion. She exhibits no edema.  Neurological: She is alert and oriented to person, place, and time. She exhibits normal muscle tone. Coordination normal.  Skin: Skin is warm and dry.  Nursing note and vitals reviewed.   ED Course  Procedures (including critical care time) Labs Review Labs Reviewed  CBC WITH DIFFERENTIAL/PLATELET - Abnormal; Notable for the following:    WBC 17.1 (*)    Neutrophils Relative % 78 (*)    Neutro Abs 13.3 (*)    Monocytes Absolute 1.1 (*)    All other components within normal limits  COMPREHENSIVE METABOLIC PANEL - Abnormal; Notable for the following:    ALT 40 (*)    All other components within normal limits  URINALYSIS, ROUTINE W REFLEX MICROSCOPIC - Abnormal; Notable for the following:    APPearance HAZY (*)    All other components within normal limits  PREGNANCY, URINE  LIPASE, BLOOD    Imaging Review Ct Abdomen Pelvis W Contrast  09/27/2014   CLINICAL DATA:  34 year old female with a history of abdominal pain of the right lower quadrant.  EXAM: CT ABDOMEN AND PELVIS WITH CONTRAST  TECHNIQUE: Multidetector CT imaging of the abdomen and pelvis was performed using the standard protocol following bolus administration of intravenous contrast.  CONTRAST:  174mL OMNIPAQUE IOHEXOL 300 MG/ML  SOLN  COMPARISON:  03/31/2010  FINDINGS: Lower chest:  Unremarkable appearance of the superficial soft tissues of chest wall.  Unremarkable appearance of heart. No pericardial fluid/thickening. No hiatal hernia.  No confluent airspace disease or pleural effusion.  Abdomen/pelvis:  Unremarkable appearance of liver, spleen, bilateral adrenal glands.  Unremarkable appearance of the gallbladder. No intrahepatic or extrahepatic biliary ductal dilatation.  No hydronephrosis or nephrolithiasis. Unremarkable appearance of the bilateral ureters.  Unremarkable appearance of the urinary bladder.  Oral contrast extends the length of the GI system. No abnormally distended small bowel or colon. Normal appendix.  No free air or significant free fluid within the abdomen.  Compared to the CT dated 03/31/2010, there is subtle stranding at the head of the pancreas/uncinate process, not present on the comparison. No significant calcifications. Enhancement of the pancreatic tissue appears within normal limits. No fluid collection.  Unremarkable appearance of the urinary bladder, uterus, and adnexa.  Unremarkable appearance of the vasculature.  Musculoskeletal:  No displaced fracture.  No significant degenerative changes.  IMPRESSION: Compared to the prior CT, there is subtle stranding with ill-defined fat at the head of the pancreas. It is uncertain if this represents motion artifact, or potentially early pancreatitis. Correlation with the patient's presentation and pancreatic lab values may be useful.  These results were called by telephone at the time of interpretation on 09/27/2014 at 12:47 pm to Dr. Jola Schmidt , who verbally acknowledged these results.  Signed,  Dulcy Fanny. Earleen Newport, DO  [facility]  [facility]   Electronically Signed   By: Corrie Mckusick D.O.   On: 09/27/2014 12:50     EKG Interpretation None  MDM   Final diagnoses:  Right lower quadrant abdominal pain    Pt is well appearing, non-toxic .  Vitals stable. No fever.  abd remains soft.  Has right periumbilical pain.  Will order CT abd/pelvis and labs.    CT negative for cholecystitis or appendicitis.  No reported vomiting, back pain or epigastric tenderness.  Lipase is reassuring.  Pt is feeling better after IVF's and pain medication and appears ready for d/c.  Discussed  Findings with patient, and I have advised her try bland diet, and gave strict return precautions and she agrees to plan and verbalized understanding.    Bufford Lope 09/27/14 2029  Jola Schmidt, MD 09/28/14 [phone]

## 2014-09-27 NOTE — Discharge Instructions (Signed)

## 2014-09-27 NOTE — ED Notes (Signed)
Woke up at 355 am with RUQ abdominal pain.

## 2015-09-16 IMAGING — CT CT ABD-PELV W/ CM
2 of 4 series · 16 of 46 positions shown, 18 images · IV contrast (Omnipaque 300)
Comparison: 03/31/2010

CLINICAL DATA: 32-year-old female with a history of abdominal pain
of the right lower quadrant.

EXAM:
CT ABDOMEN AND PELVIS WITH CONTRAST
TECHNIQUE: Multidetector CT imaging of the abdomen and pelvis was performed
using the standard protocol following bolus administration of
intravenous contrast.
CONTRAST:  100mL OMNIPAQUE IOHEXOL 300 MG/ML  SOLN

[Series 2: abd_pel_with 5.0 b40f · axial · 0.70mm/px · z∈[-465,-50]mm · 13 of 91 slices shown, 15 images]
[im 4/91  soft-tissue]
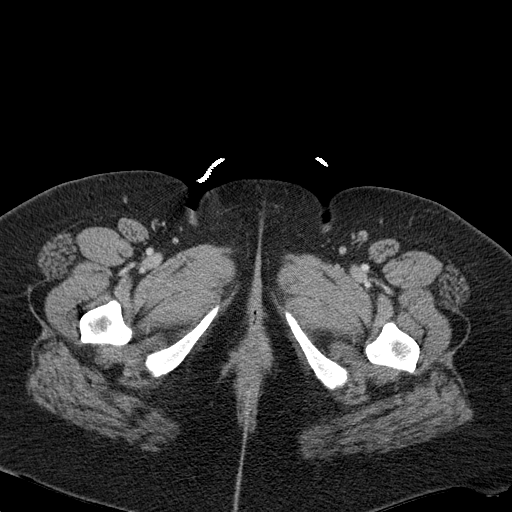
[im 4/91  bone]
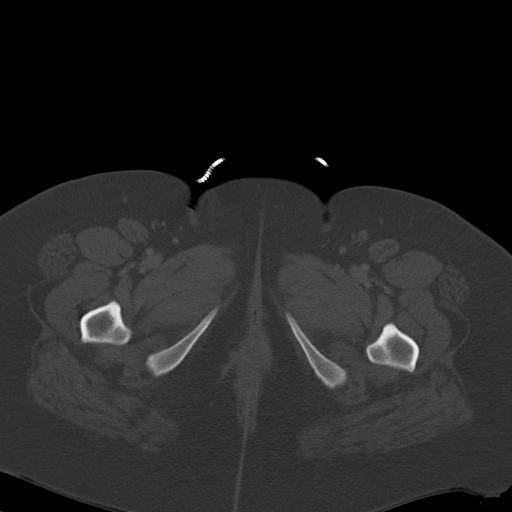
[im 11/91  soft-tissue]
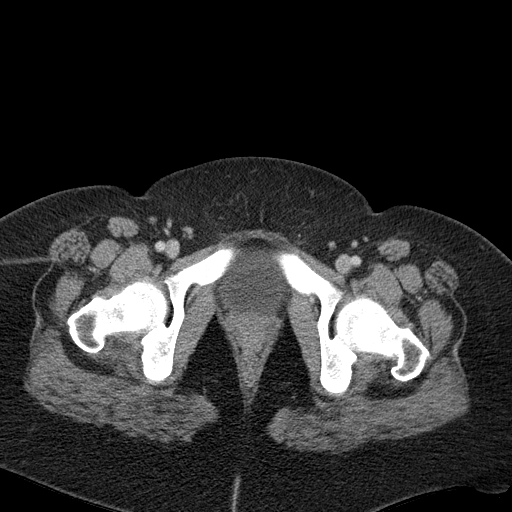
[im 19/91  soft-tissue]
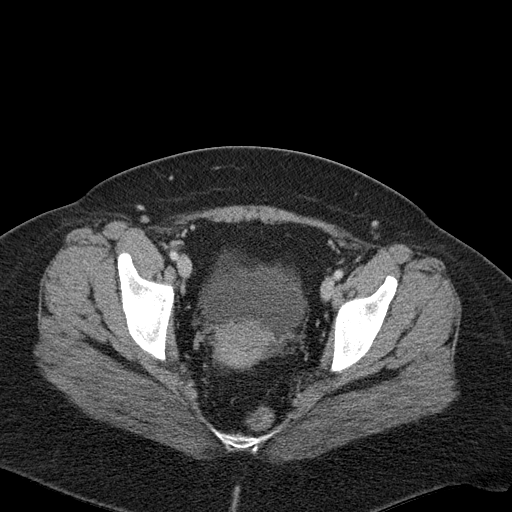
[im 26/91  soft-tissue]
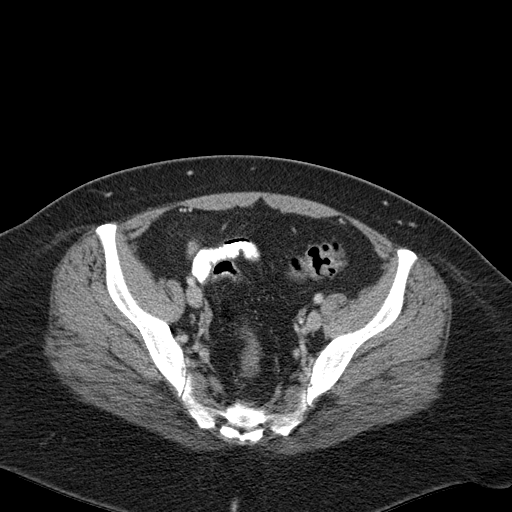
[im 33/91  soft-tissue]
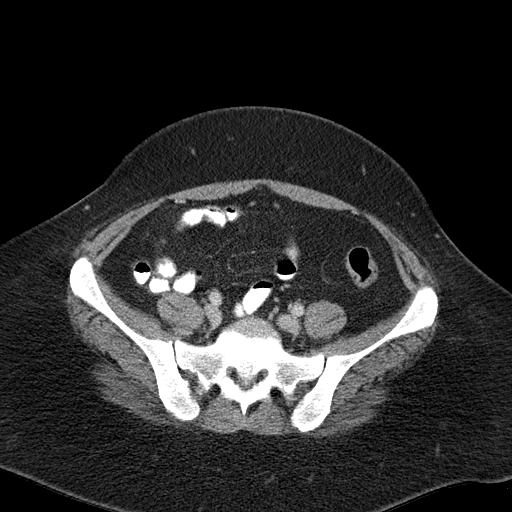
[im 40/91  soft-tissue]
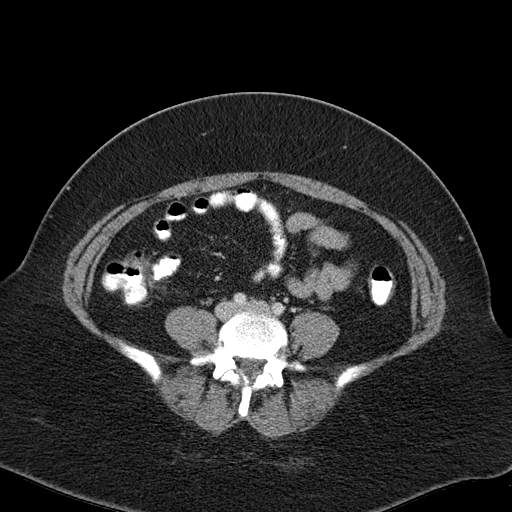
[im 47/91  soft-tissue]
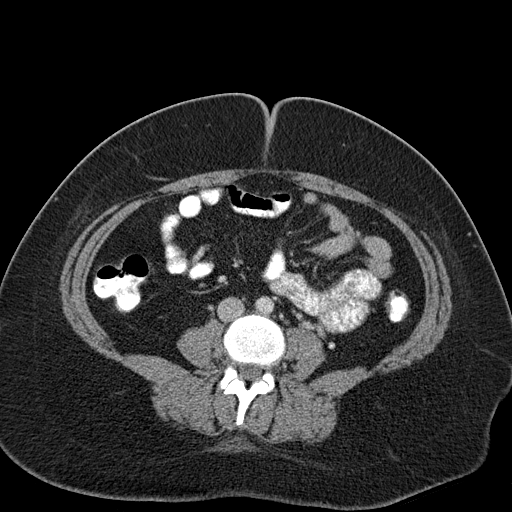
[im 51/91  soft-tissue]
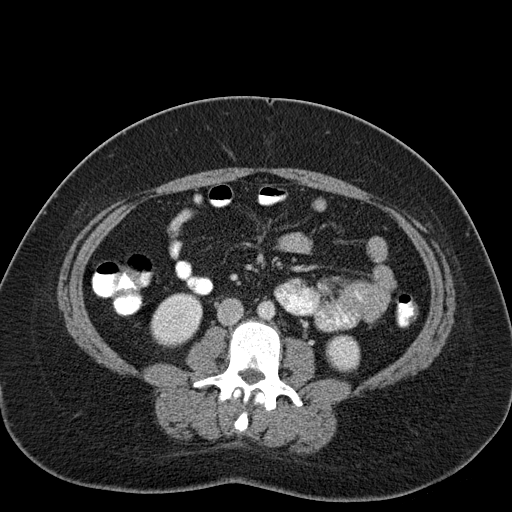
[im 58/91  soft-tissue]
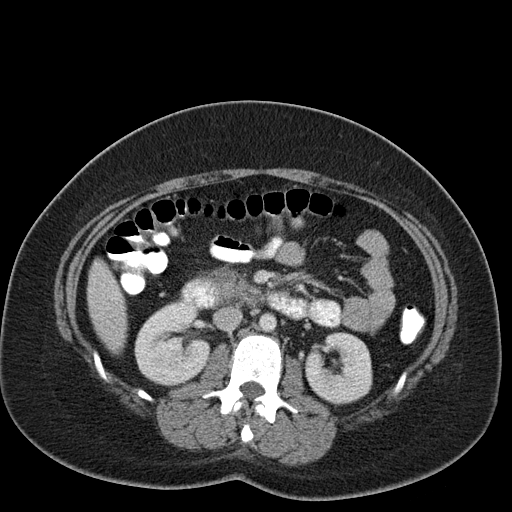
[im 58/91  bone]
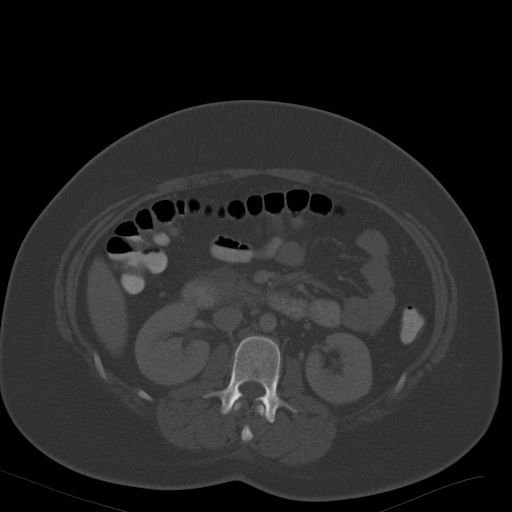
[im 65/91  soft-tissue]
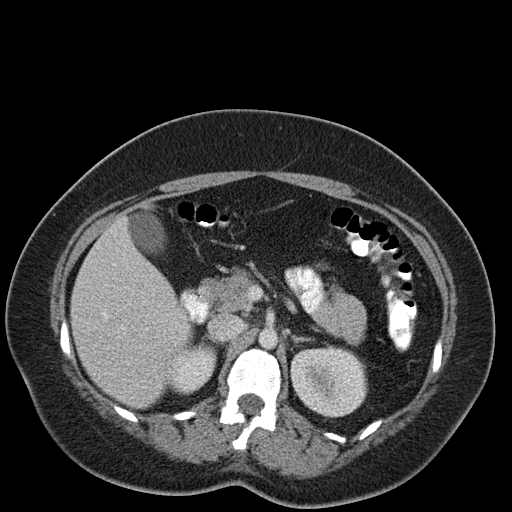
[im 73/91  soft-tissue]
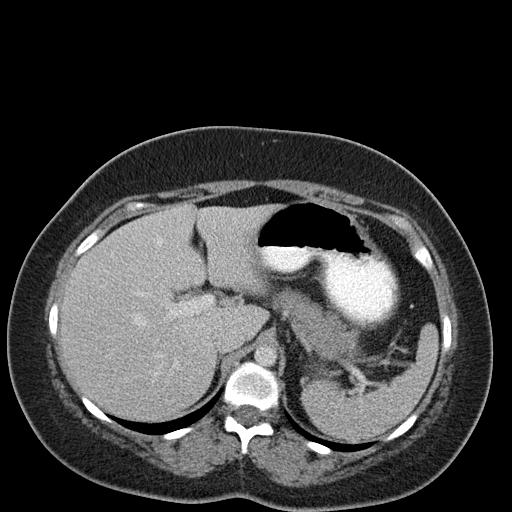
[im 80/91  soft-tissue]
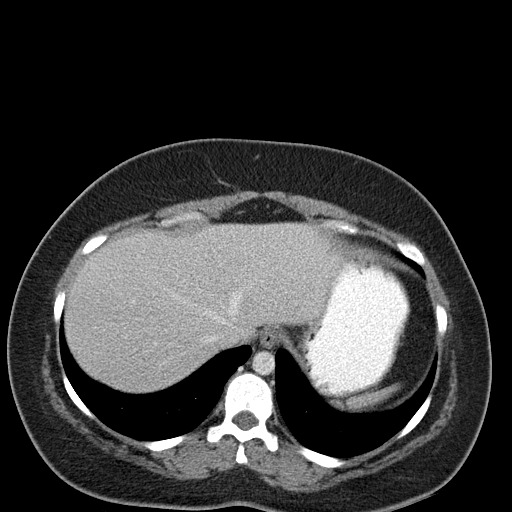
[im 87/91  soft-tissue]
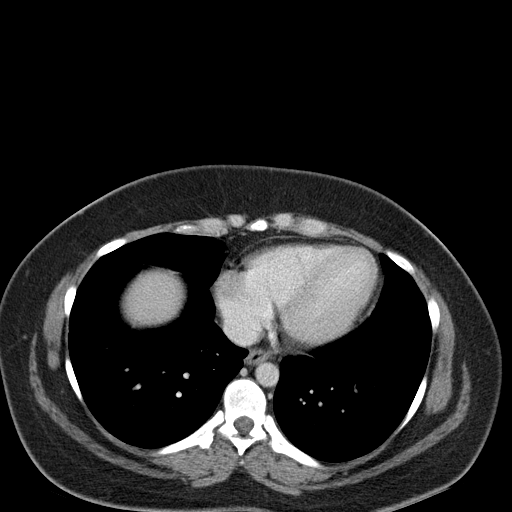

[Series 3: abd_pel_with 3.0 spo cor · coronal · 0.70mm/px · 3 of 97 slices shown]
[im 33/97  soft-tissue]
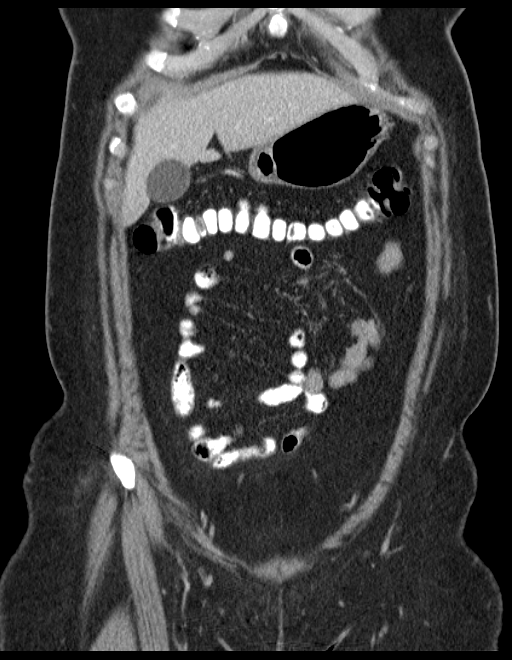
[im 43/97  soft-tissue]
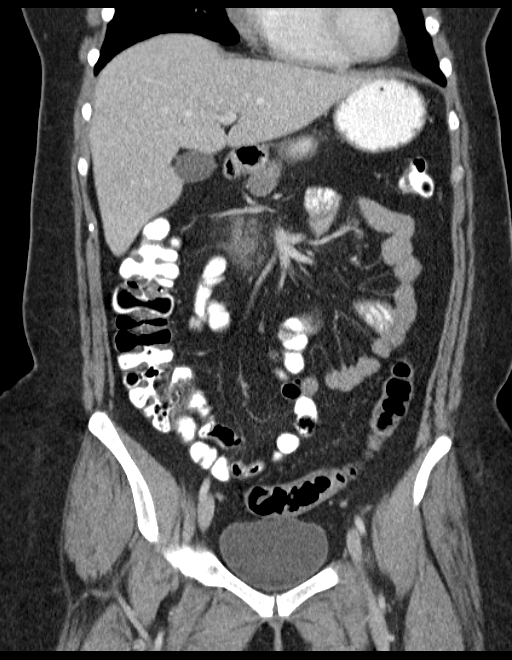
[im 54/97  soft-tissue]
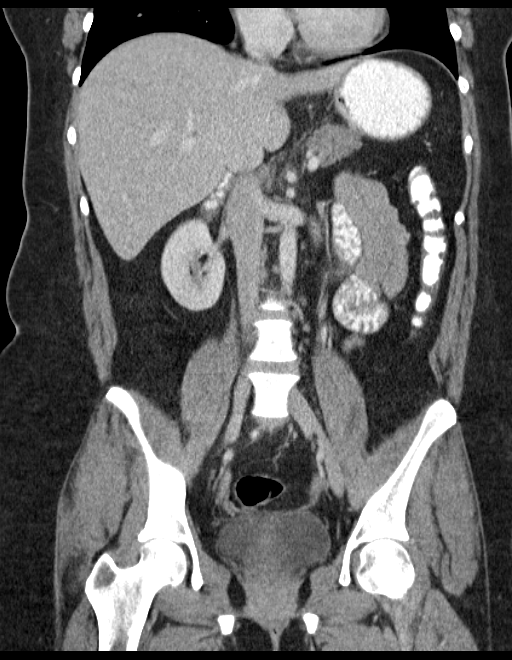

[16 of 46 positions shown; findings below may reference images not displayed]

FINDINGS: Lower chest:

Unremarkable appearance of the superficial soft tissues of chest
wall.

Unremarkable appearance of heart. No pericardial fluid/thickening.
No hiatal hernia.

No confluent airspace disease or pleural effusion.

Abdomen/pelvis:

Unremarkable appearance of liver, spleen, bilateral adrenal glands.

Unremarkable appearance of the gallbladder. No intrahepatic or
extrahepatic biliary ductal dilatation.

No hydronephrosis or nephrolithiasis. Unremarkable appearance of the
bilateral ureters. Unremarkable appearance of the urinary bladder.

Oral contrast extends the length of the GI system. No abnormally
distended small bowel or colon. Normal appendix.

No free air or significant free fluid within the abdomen.

Compared to the CT dated 03/31/2010, there is subtle stranding at
the head of the pancreas/uncinate process, not present on the
comparison. No significant calcifications. Enhancement of the
pancreatic tissue appears within normal limits. No fluid collection.

Unremarkable appearance of the urinary bladder, uterus, and adnexa.

Unremarkable appearance of the vasculature.

Musculoskeletal:

No displaced fracture.  No significant degenerative changes.
IMPRESSION: Compared to the prior CT, there is subtle stranding with ill-defined
fat at the head of the pancreas. It is uncertain if this represents
motion artifact, or potentially early pancreatitis. Correlation with
the patient's presentation and pancreatic lab values may be useful.

These results were called by telephone at the time of interpretation
on 09/27/2014 at [DATE] to Dr. Mark Laurence Flamiano , who verbally
acknowledged these results.

## 2015-12-31 ENCOUNTER — Other Ambulatory Visit: Payer: Self-pay | Admitting: Orthopedic Surgery

## 2016-01-20 ENCOUNTER — Encounter (HOSPITAL_BASED_OUTPATIENT_CLINIC_OR_DEPARTMENT_OTHER): Payer: Self-pay | Admitting: *Deleted

## 2016-01-26 ENCOUNTER — Encounter (HOSPITAL_BASED_OUTPATIENT_CLINIC_OR_DEPARTMENT_OTHER): Payer: Self-pay

## 2016-01-26 ENCOUNTER — Encounter (HOSPITAL_BASED_OUTPATIENT_CLINIC_OR_DEPARTMENT_OTHER)
Admission: RE | Disposition: A | Discharge status: Home / Self Care | Payer: Self-pay | Source: Ambulatory Visit | Attending: Orthopedic Surgery

## 2016-01-26 ENCOUNTER — Ambulatory Visit (HOSPITAL_BASED_OUTPATIENT_CLINIC_OR_DEPARTMENT_OTHER)
Admission: RE | Admit: 2016-01-26 | Discharge: 2016-01-26 | Disposition: A | Discharge status: Home / Self Care | Payer: 59 | Source: Ambulatory Visit | Attending: Orthopedic Surgery | Admitting: Orthopedic Surgery

## 2016-01-26 ENCOUNTER — Ambulatory Visit (HOSPITAL_BASED_OUTPATIENT_CLINIC_OR_DEPARTMENT_OTHER): Payer: 59 | Admitting: Anesthesiology

## 2016-01-26 DIAGNOSIS — F17210 Nicotine dependence, cigarettes, uncomplicated: Secondary | ICD-10-CM | POA: Diagnosis not present

## 2016-01-26 DIAGNOSIS — Z79899 Other long term (current) drug therapy: Secondary | ICD-10-CM | POA: Diagnosis not present

## 2016-01-26 DIAGNOSIS — M67431 Ganglion, right wrist: Secondary | ICD-10-CM | POA: Insufficient documentation

## 2016-01-26 HISTORY — DX: Anxiety disorder, unspecified: F41.9

## 2016-01-26 HISTORY — PX: MASS EXCISION: SHX2000

## 2016-01-26 SURGERY — EXCISION MASS
Anesthesia: General | Site: Wrist | Laterality: Right

## 2016-01-26 MED ORDER — HYDROMORPHONE HCL 1 MG/ML IJ SOLN
INTRAMUSCULAR | Status: AC
  Filled 2016-01-26: qty 1

## 2016-01-26 MED ORDER — FENTANYL CITRATE (PF) 100 MCG/2ML IJ SOLN
50.0000 ug | INTRAMUSCULAR | Status: DC | PRN
  Administered 2016-01-26: 100 ug via INTRAVENOUS

## 2016-01-26 MED ORDER — OXYCODONE HCL 5 MG PO TABS
5.0000 mg | ORAL_TABLET | Freq: Once | ORAL | Status: AC | PRN
  Administered 2016-01-26: 5 mg via ORAL

## 2016-01-26 MED ORDER — ONDANSETRON HCL 4 MG/2ML IJ SOLN: 4.0000 mg | Freq: Once | INTRAMUSCULAR | Status: DC | PRN

## 2016-01-26 MED ORDER — MEPERIDINE HCL 25 MG/ML IJ SOLN: 6.2500 mg | INTRAMUSCULAR | Status: DC | PRN

## 2016-01-26 MED ORDER — SCOPOLAMINE 1 MG/3DAYS TD PT72: 1.0000 | MEDICATED_PATCH | Freq: Once | TRANSDERMAL | Status: DC | PRN

## 2016-01-26 MED ORDER — ONDANSETRON HCL 4 MG/2ML IJ SOLN
INTRAMUSCULAR | Status: AC
  Filled 2016-01-26: qty 2

## 2016-01-26 MED ORDER — DEXAMETHASONE SODIUM PHOSPHATE 10 MG/ML IJ SOLN
INTRAMUSCULAR | Status: AC
  Filled 2016-01-26: qty 1

## 2016-01-26 MED ORDER — LACTATED RINGERS IV SOLN
INTRAVENOUS | Status: DC
  Administered 2016-01-26 (×2): via INTRAVENOUS

## 2016-01-26 MED ORDER — KETOROLAC TROMETHAMINE 30 MG/ML IJ SOLN
INTRAMUSCULAR | Status: DC | PRN
  Administered 2016-01-26: 30 mg via INTRAVENOUS

## 2016-01-26 MED ORDER — GLYCOPYRROLATE 0.2 MG/ML IJ SOLN: 0.2000 mg | Freq: Once | INTRAMUSCULAR | Status: DC | PRN

## 2016-01-26 MED ORDER — BUPIVACAINE HCL (PF) 0.25 % IJ SOLN
INTRAMUSCULAR | Status: DC | PRN
  Administered 2016-01-26: 6 mL

## 2016-01-26 MED ORDER — FENTANYL CITRATE (PF) 100 MCG/2ML IJ SOLN
INTRAMUSCULAR | Status: AC
  Filled 2016-01-26: qty 2

## 2016-01-26 MED ORDER — CHLORHEXIDINE GLUCONATE 4 % EX LIQD: 60.0000 mL | Freq: Once | CUTANEOUS | Status: DC

## 2016-01-26 MED ORDER — HYDROCODONE-ACETAMINOPHEN 5-325 MG PO TABS: ORAL_TABLET | ORAL | 0 refills | Status: DC

## 2016-01-26 MED ORDER — LIDOCAINE 2% (20 MG/ML) 5 ML SYRINGE
INTRAMUSCULAR | Status: AC
  Filled 2016-01-26: qty 5

## 2016-01-26 MED ORDER — OXYCODONE HCL 5 MG/5ML PO SOLN: 5.0000 mg | Freq: Once | ORAL | Status: AC | PRN

## 2016-01-26 MED ORDER — MIDAZOLAM HCL 2 MG/2ML IJ SOLN
INTRAMUSCULAR | Status: AC
  Filled 2016-01-26: qty 2

## 2016-01-26 MED ORDER — PROPOFOL 10 MG/ML IV BOLUS
INTRAVENOUS | Status: DC | PRN
  Administered 2016-01-26: 200 mg via INTRAVENOUS

## 2016-01-26 MED ORDER — CEFAZOLIN SODIUM-DEXTROSE 2-4 GM/100ML-% IV SOLN
INTRAVENOUS | Status: AC
  Filled 2016-01-26: qty 100

## 2016-01-26 MED ORDER — PROPOFOL 10 MG/ML IV BOLUS
INTRAVENOUS | Status: AC
  Filled 2016-01-26: qty 20

## 2016-01-26 MED ORDER — OXYCODONE HCL 5 MG PO TABS
ORAL_TABLET | ORAL | Status: AC
  Filled 2016-01-26: qty 1

## 2016-01-26 MED ORDER — LIDOCAINE 2% (20 MG/ML) 5 ML SYRINGE
INTRAMUSCULAR | Status: DC | PRN
  Administered 2016-01-26: 100 mg via INTRAVENOUS

## 2016-01-26 MED ORDER — CEFAZOLIN SODIUM-DEXTROSE 2-4 GM/100ML-% IV SOLN
2.0000 g | INTRAVENOUS | Status: AC
  Administered 2016-01-26: 2 g via INTRAVENOUS

## 2016-01-26 MED ORDER — HYDROMORPHONE HCL 1 MG/ML IJ SOLN
0.2500 mg | INTRAMUSCULAR | Status: DC | PRN
  Administered 2016-01-26 (×2): 0.5 mg via INTRAVENOUS

## 2016-01-26 MED ORDER — MIDAZOLAM HCL 2 MG/2ML IJ SOLN
1.0000 mg | INTRAMUSCULAR | Status: DC | PRN
  Administered 2016-01-26: 2 mg via INTRAVENOUS

## 2016-01-26 SURGICAL SUPPLY — 49 items
BANDAGE ACE 3X5.8 VEL STRL LF (GAUZE/BANDAGES/DRESSINGS) ×2 IMPLANT
BANDAGE COBAN STERILE 2 (GAUZE/BANDAGES/DRESSINGS) IMPLANT
BENZOIN TINCTURE PRP APPL 2/3 (GAUZE/BANDAGES/DRESSINGS) IMPLANT
BLADE MINI RND TIP GREEN BEAV (BLADE) IMPLANT
BLADE SURG 15 STRL LF DISP TIS (BLADE) ×2 IMPLANT
BLADE SURG 15 STRL SS (BLADE) ×2
BNDG COHESIVE 1X5 TAN STRL LF (GAUZE/BANDAGES/DRESSINGS) IMPLANT
BNDG CONFORM 2 STRL LF (GAUZE/BANDAGES/DRESSINGS) IMPLANT
BNDG ELASTIC 2X5.8 VLCR STR LF (GAUZE/BANDAGES/DRESSINGS) IMPLANT
BNDG ESMARK 4X9 LF (GAUZE/BANDAGES/DRESSINGS) ×2 IMPLANT
BNDG GAUZE 1X2.1 STRL (MISCELLANEOUS) IMPLANT
BNDG GAUZE ELAST 4 BULKY (GAUZE/BANDAGES/DRESSINGS) ×2 IMPLANT
BNDG PLASTER X FAST 3X3 WHT LF (CAST SUPPLIES) IMPLANT
CHLORAPREP W/TINT 26ML (MISCELLANEOUS) ×2 IMPLANT
CORDS BIPOLAR (ELECTRODE) ×2 IMPLANT
COVER BACK TABLE 60X90IN (DRAPES) ×2 IMPLANT
COVER MAYO STAND STRL (DRAPES) ×2 IMPLANT
CUFF TOURNIQUET SINGLE 18IN (TOURNIQUET CUFF) ×2 IMPLANT
DRAPE EXTREMITY T 121X128X90 (DRAPE) ×2 IMPLANT
DRAPE SURG 17X23 STRL (DRAPES) ×2 IMPLANT
GAUZE SPONGE 4X4 12PLY STRL (GAUZE/BANDAGES/DRESSINGS) ×2 IMPLANT
GAUZE XEROFORM 1X8 LF (GAUZE/BANDAGES/DRESSINGS) ×2 IMPLANT
GLOVE BIO SURGEON STRL SZ7.5 (GLOVE) ×2 IMPLANT
GLOVE BIOGEL PI IND STRL 8 (GLOVE) ×1 IMPLANT
GLOVE BIOGEL PI INDICATOR 8 (GLOVE) ×1
GOWN STRL REUS W/ TWL LRG LVL3 (GOWN DISPOSABLE) ×1 IMPLANT
GOWN STRL REUS W/TWL LRG LVL3 (GOWN DISPOSABLE) ×1
GOWN STRL REUS W/TWL XL LVL3 (GOWN DISPOSABLE) ×2 IMPLANT
NEEDLE HYPO 25X1 1.5 SAFETY (NEEDLE) ×2 IMPLANT
NS IRRIG 1000ML POUR BTL (IV SOLUTION) ×2 IMPLANT
PACK BASIN DAY SURGERY FS (CUSTOM PROCEDURE TRAY) ×2 IMPLANT
PAD CAST 3X4 CTTN HI CHSV (CAST SUPPLIES) IMPLANT
PAD CAST 4YDX4 CTTN HI CHSV (CAST SUPPLIES) IMPLANT
PADDING CAST ABS 4INX4YD NS (CAST SUPPLIES) ×1
PADDING CAST ABS COTTON 4X4 ST (CAST SUPPLIES) ×1 IMPLANT
PADDING CAST COTTON 3X4 STRL (CAST SUPPLIES)
PADDING CAST COTTON 4X4 STRL (CAST SUPPLIES)
STOCKINETTE 4X48 STRL (DRAPES) ×2 IMPLANT
STRIP CLOSURE SKIN 1/2X4 (GAUZE/BANDAGES/DRESSINGS) ×2 IMPLANT
SUT ETHILON 3 0 PS 1 (SUTURE) IMPLANT
SUT ETHILON 4 0 PS 2 18 (SUTURE) ×2 IMPLANT
SUT ETHILON 5 0 P 3 18 (SUTURE)
SUT MNCRL AB 4-0 PS2 18 (SUTURE) ×2 IMPLANT
SUT NYLON ETHILON 5-0 P-3 1X18 (SUTURE) IMPLANT
SUT VIC AB 4-0 P2 18 (SUTURE) ×2 IMPLANT
SYR BULB 3OZ (MISCELLANEOUS) ×2 IMPLANT
SYR CONTROL 10ML LL (SYRINGE) ×2 IMPLANT
TOWEL OR 17X24 6PK STRL BLUE (TOWEL DISPOSABLE) ×4 IMPLANT
UNDERPAD 30X30 (UNDERPADS AND DIAPERS) ×2 IMPLANT

## 2016-01-26 NOTE — Anesthesia Procedure Notes (Signed)
Procedure Name: LMA Insertion Date/Time: 01/26/2016 1:46 PM Performed by: Maryella Shivers Pre-anesthesia Checklist: Patient identified, Emergency Drugs available, Suction available and Patient being monitored Patient Re-evaluated:Patient Re-evaluated prior to inductionOxygen Delivery Method: Circle system utilized Preoxygenation: Pre-oxygenation with 100% oxygen Intubation Type: IV induction Ventilation: Mask ventilation without difficulty LMA: LMA inserted LMA Size: 4.0 Number of attempts: 1 Airway Equipment and Method: Bite block Placement Confirmation: positive ETCO2 Tube secured with: Tape Dental Injury: Teeth and Oropharynx as per pre-operative assessment

## 2016-01-26 NOTE — H&P (Signed)
  Connie Jensen is an 35 y.o. female.   Chief Complaint: right wrist ganglion HPI:  35 yo rhd female with mass on dorsum right wrist x 1 year.  It has enlarged.  It is bothersome to her.  She wishes to have it removed.  Allergies: No Known Allergies  Past Medical History:  Diagnosis Date  . Anxiety     Past Surgical History:  Procedure Laterality Date  . WISDOM TOOTH EXTRACTION      Family History: History reviewed. No pertinent family history.  Social History:   reports that she has been smoking Cigarettes.  She has been smoking about 1.00 pack per day. She has never used smokeless tobacco. She reports that she does not drink alcohol or use drugs.  Medications: Medications Prior to Admission  Medication Sig Dispense Refill  . escitalopram (LEXAPRO) 10 MG tablet Take 10 mg by mouth daily.    Marland Kitchen HYDROcodone-acetaminophen (NORCO/VICODIN) 5-325 MG per tablet Take one-two tabs po q 4-6 hrs prn pain 10 tablet 0  . Norethin Ace-Eth Estrad-FE (GILDESS FE 1/20 PO) Take by mouth.    . zolpidem (AMBIEN) 5 MG tablet Take 5 mg by mouth at bedtime.      No results found for this or any previous visit (from the past 48 hour(s)).  No results found.   A comprehensive review of systems was negative.  Blood pressure 131/75, pulse 85, temperature 98.4 F (36.9 C), temperature source Oral, resp. rate 16, height 5\' 2"  ([id number] m), weight 87.1 kg (192 lb), last menstrual period 01/20/2016, SpO2 99 %.  General appearance: alert, cooperative and appears stated age Head: Normocephalic, without obvious abnormality, atraumatic Neck: supple, symmetrical, trachea midline Resp: clear to auscultation bilaterally Cardio: regular rate and rhythm GI: non-tender Extremities: Intact sensation and capillary refill all digits.  +epl/fpl/io.  No wounds.  Pulses: 2+ and symmetric Skin: Skin color, texture, turgor normal. No rashes or lesions Neurologic: Grossly  normal Incision/Wound:none  Assessment/Plan Right wrist dorsal ganglion cyst.  Non operative and operative treatment options were discussed with the patient and patient wishes to proceed with operative treatment. Risks, benefits, and alternatives of surgery were discussed and the patient agrees with the plan of care.   Leandro Berkowitz R 01/26/2016, 12:40 PM

## 2016-01-26 NOTE — Discharge Instructions (Addendum)

## 2016-01-26 NOTE — Op Note (Signed)
[id number] 

## 2016-01-26 NOTE — Anesthesia Postprocedure Evaluation (Signed)
Anesthesia Post Note  Patient: Connie Jensen  Procedure(s) Performed: Procedure(s) (LRB): RIGHT WRIST EXCISION GANGLION (Right)  Patient location during evaluation: PACU Anesthesia Type: General Level of consciousness: awake and alert Pain management: pain level controlled Vital Signs Assessment: post-procedure vital signs reviewed and stable Respiratory status: spontaneous breathing, nonlabored ventilation and respiratory function stable Cardiovascular status: blood pressure returned to baseline and stable Postop Assessment: no signs of nausea or vomiting Anesthetic complications: no    Last Vitals:  Vitals:   01/26/16 1500 01/26/16 1541  BP: (!) 128/91 138/88  Pulse: 92 88  Resp: 15 16  Temp:  36.6 C    Last Pain:  Vitals:   01/26/16 1541  TempSrc: Oral  PainSc: 4                  Ercia Crisafulli A

## 2016-01-26 NOTE — Anesthesia Preprocedure Evaluation (Signed)
Anesthesia Evaluation  Patient identified by MRN, date of birth, ID band Patient awake    Reviewed: Allergy & Precautions, NPO status , Patient's Chart, lab work & pertinent test results  Airway Mallampati: I  TM Distance: >3 FB Neck ROM: Full    Dental  (+) Teeth Intact, Dental Advisory Given   Pulmonary Current Smoker,  breath sounds clear to auscultation        Cardiovascular Rhythm:Regular Rate:Normal     Neuro/Psych    GI/Hepatic   Endo/Other    Renal/GU      Musculoskeletal   Abdominal   Peds  Hematology   Anesthesia Other Findings   Reproductive/Obstetrics                             Anesthesia Physical Anesthesia Plan  ASA: II  Anesthesia Plan: General   Post-op Pain Management:    Induction: Intravenous  Airway Management Planned: LMA  Additional Equipment:   Intra-op Plan:   Post-operative Plan: Extubation in OR  Informed Consent: I have reviewed the patients History and Physical, chart, labs and discussed the procedure including the risks, benefits and alternatives for the proposed anesthesia with the patient or authorized representative who has indicated his/her understanding and acceptance.   Dental advisory given  Plan Discussed with: CRNA, Anesthesiologist and Surgeon  Anesthesia Plan Comments:         Anesthesia Quick Evaluation  

## 2016-01-26 NOTE — Transfer of Care (Signed)
Immediate Anesthesia Transfer of Care Note  Patient: Thermon Leyland  Procedure(s) Performed: Procedure(s): RIGHT WRIST EXCISION GANGLION (Right)  Patient Location: PACU  Anesthesia Type:General  Level of Consciousness: awake, alert  and oriented  Airway & Oxygen Therapy: Patient Spontanous Breathing and Patient connected to face mask oxygen  Post-op Assessment: Report given to RN and Post -op Vital signs reviewed and stable  Post vital signs: Reviewed and stable  Last Vitals:  Vitals:   01/26/16 1157  BP: 131/75  Pulse: 85  Resp: 16  Temp: 36.9 C    Last Pain:  Vitals:   01/26/16 1157  TempSrc: Oral  PainSc: 2       Patients Stated Pain Goal: 2 [id number] [phone]  Complications: No apparent anesthesia complications

## 2016-01-26 NOTE — Brief Op Note (Signed)
01/26/2016  2:14 PM  PATIENT:  Connie Jensen  35 y.o. female  PRE-OPERATIVE DIAGNOSIS:  RIGHT WRIST DORSAL GANGLION  POST-OPERATIVE DIAGNOSIS:  RIGHT WRIST DORSAL GANGLION  PROCEDURE:  Procedure(s): RIGHT WRIST EXCISION GANGLION (Right)  SURGEON:  Surgeon(s) and Role:    * Leanora Cover, MD - Primary  PHYSICIAN ASSISTANT:   ASSISTANTS: none   ANESTHESIA:   general  EBL:  Total I/O In: 1000 [I.V.:1000] Out: -   BLOOD ADMINISTERED:none  DRAINS: none   LOCAL MEDICATIONS USED:  MARCAINE     SPECIMEN:  Source of Specimen:  right wrist  DISPOSITION OF SPECIMEN:  PATHOLOGY  COUNTS:  YES  TOURNIQUET:   Total Tourniquet Time Documented: Upper Arm (Right) - 19 minutes Total: Upper Arm (Right) - 19 minutes   DICTATION: .Other Dictation: Dictation Number [id number]  PLAN OF CARE: Discharge to home after PACU  PATIENT DISPOSITION:  PACU - hemodynamically stable.

## 2016-01-26 NOTE — Op Note (Signed)
NAMESHAKIYLA, WOODROME NO.:  [id number]  MEDICAL RECORD NO.:  [id number]  LOCATION:                                 FACILITY:  PHYSICIAN:  Leanora Cover, MD             DATE OF BIRTH:  DATE OF PROCEDURE:  01/26/2016 DATE OF DISCHARGE:                              OPERATIVE REPORT   PREOPERATIVE DIAGNOSIS:  Right wrist dorsal ganglion cyst.  POSTOPERATIVE DIAGNOSIS:  Right wrist dorsal ganglion cyst.  PROCEDURE:  Right wrist excision of dorsal ganglion cyst.  SURGEON:  Leanora Cover, MD  ASSISTANT:  None.  ANESTHESIA:  General.  IV FLUIDS:  Per Anesthesia flow sheet.  ESTIMATED BLOOD LOSS:  Minimal.  COMPLICATIONS:  None.  SPECIMENS:  None.  TOURNIQUET TIME:  19 minutes.  DISPOSITION:  Stable to PACU.  INDICATIONS:  Ms. Divita is a 35 year old female who has noted a mass in the dorsum of her wrist.  It is bothersome to her.  She wished to have it excised.  Risks, benefits, and alternatives of surgery were discussed including risk of blood loss; infection; damage to nerves, vessels, tendons, ligaments, bone, failure of surgery, need for additional surgery; complications with wound healing; continued pain; recurrence of mass.  She voiced understanding of these risks and elected to proceed.  OPERATIVE COURSE:  After being identified preoperatively by myself, the patient and I agreed upon procedure and site of procedure.  Surgical site was marked.  Risks, benefits, and alternatives of surgery were reviewed and she wished to proceed.  Surgical consent had been signed. She was given IV Ancef as preoperative antibiotic prophylaxis.  She was transported to the operating room and placed on the operating room table in a supine position with the right upper extremity on arm board. General anesthesia was induced by Anesthesiology.  Right upper extremity was prepped and draped in normal sterile orthopedic fashion.  Surgical pause was performed between surgeons,  anesthesia, and operating room staff and all were in agreement as to the patient, procedure, and site of procedure.  Tourniquet at the proximal aspect of the extremity was inflated to 250 mmHg after exsanguination of the limb with an Esmarch bandage.  A transverse incision was made over the mass at the wrist. This was carried into the subcutaneous tissues by spreading technique. Care was taken to protect all neurovascular structures.  The mass was identified.  It was subjective retinacular.  It was coming from ulnar to the second dorsal compartment tendons.  The mass was freed up of soft tissue attachments and removed.  The rent in the capsule was then repaired with a 4-0 Vicryl suture in a figure-of-eight fashion.  The wound was copiously irrigated with sterile saline.  Single inverted interrupted Vicryl sutures were placed in the subcutaneous tissues and skin was closed with a running subcuticular 4-0 Monocryl suture, which was augmented with benzoin and Steri-Strips.  The mass was sent to Pathology for examination.  The wound was injected with 6 mL of 0.25% plain Marcaine to aid in postoperative analgesia.  It was then dressed with sterile 4x4s and ABD and wrapped with Kerlix and Ace bandage. Tourniquet was deflated  at 19 minutes.  Fingertips were pink with brisk capillary refill after deflation of the tourniquet.  Operative drapes were broken down.  The patient was awoken from anesthesia safely.  She was transferred back to the stretcher and taken to PACU in stable condition.  I will see her back in the office in 1 week for postoperative followup.  I will give her [place], one to two p.o. q.6 hours p.r.n. pain, dispensed #20.     Leanora Cover, MD     KK/MEDQ  D:  01/26/2016  T:  01/26/2016  Job:  [id number]

## 2016-01-27 ENCOUNTER — Encounter (HOSPITAL_BASED_OUTPATIENT_CLINIC_OR_DEPARTMENT_OTHER): Payer: Self-pay | Admitting: Orthopedic Surgery

## 2016-07-04 DIAGNOSIS — R05 Cough: Secondary | ICD-10-CM | POA: Diagnosis not present

## 2016-07-04 DIAGNOSIS — J060 Acute laryngopharyngitis: Secondary | ICD-10-CM | POA: Diagnosis not present

## 2016-08-24 DIAGNOSIS — Z79899 Other long term (current) drug therapy: Secondary | ICD-10-CM | POA: Diagnosis not present

## 2016-08-24 DIAGNOSIS — D485 Neoplasm of uncertain behavior of skin: Secondary | ICD-10-CM | POA: Diagnosis not present

## 2016-08-24 DIAGNOSIS — L400 Psoriasis vulgaris: Secondary | ICD-10-CM | POA: Diagnosis not present

## 2016-08-24 DIAGNOSIS — D225 Melanocytic nevi of trunk: Secondary | ICD-10-CM | POA: Diagnosis not present

## 2016-09-14 DIAGNOSIS — Z01419 Encounter for gynecological examination (general) (routine) without abnormal findings: Secondary | ICD-10-CM | POA: Diagnosis not present

## 2016-09-26 DIAGNOSIS — S20469A Insect bite (nonvenomous) of unspecified back wall of thorax, initial encounter: Secondary | ICD-10-CM | POA: Diagnosis not present

## 2016-12-15 DIAGNOSIS — G4700 Insomnia, unspecified: Secondary | ICD-10-CM | POA: Diagnosis not present

## 2016-12-15 DIAGNOSIS — H9209 Otalgia, unspecified ear: Secondary | ICD-10-CM | POA: Diagnosis not present

## 2016-12-28 DIAGNOSIS — Z79899 Other long term (current) drug therapy: Secondary | ICD-10-CM | POA: Diagnosis not present

## 2016-12-28 DIAGNOSIS — L400 Psoriasis vulgaris: Secondary | ICD-10-CM | POA: Diagnosis not present

## 2017-04-25 DIAGNOSIS — L219 Seborrheic dermatitis, unspecified: Secondary | ICD-10-CM | POA: Diagnosis not present

## 2017-04-25 DIAGNOSIS — L400 Psoriasis vulgaris: Secondary | ICD-10-CM | POA: Diagnosis not present

## 2017-04-25 DIAGNOSIS — Z79899 Other long term (current) drug therapy: Secondary | ICD-10-CM | POA: Diagnosis not present

## 2017-06-01 DIAGNOSIS — Z0000 Encounter for general adult medical examination without abnormal findings: Secondary | ICD-10-CM | POA: Diagnosis not present

## 2017-06-01 DIAGNOSIS — R7301 Impaired fasting glucose: Secondary | ICD-10-CM | POA: Diagnosis not present

## 2017-06-07 DIAGNOSIS — Z0000 Encounter for general adult medical examination without abnormal findings: Secondary | ICD-10-CM | POA: Diagnosis not present

## 2017-08-23 DIAGNOSIS — L400 Psoriasis vulgaris: Secondary | ICD-10-CM | POA: Diagnosis not present

## 2017-08-23 DIAGNOSIS — Z79899 Other long term (current) drug therapy: Secondary | ICD-10-CM | POA: Diagnosis not present

## 2017-09-26 DIAGNOSIS — E049 Nontoxic goiter, unspecified: Secondary | ICD-10-CM | POA: Diagnosis not present

## 2017-09-26 DIAGNOSIS — Z01419 Encounter for gynecological examination (general) (routine) without abnormal findings: Secondary | ICD-10-CM | POA: Diagnosis not present

## 2017-12-13 DIAGNOSIS — L409 Psoriasis, unspecified: Secondary | ICD-10-CM | POA: Diagnosis not present

## 2017-12-15 DIAGNOSIS — Z79899 Other long term (current) drug therapy: Secondary | ICD-10-CM | POA: Diagnosis not present

## 2017-12-15 DIAGNOSIS — L400 Psoriasis vulgaris: Secondary | ICD-10-CM | POA: Diagnosis not present

## 2018-01-24 DIAGNOSIS — E049 Nontoxic goiter, unspecified: Secondary | ICD-10-CM | POA: Diagnosis not present

## 2018-01-24 DIAGNOSIS — G4700 Insomnia, unspecified: Secondary | ICD-10-CM | POA: Diagnosis not present

## 2018-01-24 DIAGNOSIS — Z720 Tobacco use: Secondary | ICD-10-CM | POA: Diagnosis not present

## 2018-04-02 DIAGNOSIS — R21 Rash and other nonspecific skin eruption: Secondary | ICD-10-CM | POA: Diagnosis not present

## 2018-04-02 DIAGNOSIS — Z6836 Body mass index (BMI) 36.0-36.9, adult: Secondary | ICD-10-CM | POA: Diagnosis not present

## 2018-04-02 DIAGNOSIS — M25511 Pain in right shoulder: Secondary | ICD-10-CM | POA: Diagnosis not present

## 2018-04-15 DIAGNOSIS — B379 Candidiasis, unspecified: Secondary | ICD-10-CM | POA: Diagnosis not present

## 2018-04-15 DIAGNOSIS — L409 Psoriasis, unspecified: Secondary | ICD-10-CM | POA: Diagnosis not present

## 2018-04-15 DIAGNOSIS — N911 Secondary amenorrhea: Secondary | ICD-10-CM | POA: Diagnosis not present

## 2018-04-28 DIAGNOSIS — L409 Psoriasis, unspecified: Secondary | ICD-10-CM | POA: Diagnosis not present

## 2018-05-02 DIAGNOSIS — L400 Psoriasis vulgaris: Secondary | ICD-10-CM | POA: Diagnosis not present

## 2018-05-02 DIAGNOSIS — L408 Other psoriasis: Secondary | ICD-10-CM | POA: Diagnosis not present

## 2018-05-02 DIAGNOSIS — L209 Atopic dermatitis, unspecified: Secondary | ICD-10-CM | POA: Diagnosis not present

## 2018-05-14 DIAGNOSIS — G4700 Insomnia, unspecified: Secondary | ICD-10-CM | POA: Diagnosis not present

## 2018-05-14 DIAGNOSIS — Z720 Tobacco use: Secondary | ICD-10-CM | POA: Diagnosis not present

## 2018-05-14 DIAGNOSIS — D72828 Other elevated white blood cell count: Secondary | ICD-10-CM | POA: Diagnosis not present

## 2018-05-14 DIAGNOSIS — R7301 Impaired fasting glucose: Secondary | ICD-10-CM | POA: Diagnosis not present

## 2018-05-17 DIAGNOSIS — G4700 Insomnia, unspecified: Secondary | ICD-10-CM | POA: Diagnosis not present

## 2018-05-17 DIAGNOSIS — R591 Generalized enlarged lymph nodes: Secondary | ICD-10-CM | POA: Diagnosis not present

## 2018-05-17 DIAGNOSIS — L404 Guttate psoriasis: Secondary | ICD-10-CM | POA: Diagnosis not present

## 2018-05-17 DIAGNOSIS — L309 Dermatitis, unspecified: Secondary | ICD-10-CM | POA: Diagnosis not present

## 2018-05-17 DIAGNOSIS — Z0000 Encounter for general adult medical examination without abnormal findings: Secondary | ICD-10-CM | POA: Diagnosis not present

## 2018-05-17 DIAGNOSIS — L409 Psoriasis, unspecified: Secondary | ICD-10-CM | POA: Diagnosis not present

## 2018-05-18 ENCOUNTER — Ambulatory Visit: Payer: Self-pay | Admitting: Allergy & Immunology

## 2018-05-21 DIAGNOSIS — L739 Follicular disorder, unspecified: Secondary | ICD-10-CM | POA: Diagnosis not present

## 2018-05-31 DIAGNOSIS — L409 Psoriasis, unspecified: Secondary | ICD-10-CM | POA: Diagnosis not present

## 2018-05-31 DIAGNOSIS — L308 Other specified dermatitis: Secondary | ICD-10-CM | POA: Diagnosis not present

## 2018-05-31 DIAGNOSIS — L404 Guttate psoriasis: Secondary | ICD-10-CM | POA: Diagnosis not present

## 2018-06-11 ENCOUNTER — Ambulatory Visit: Payer: Self-pay | Admitting: Allergy

## 2018-06-21 ENCOUNTER — Ambulatory Visit: Payer: 59 | Admitting: Allergy

## 2018-06-21 ENCOUNTER — Encounter: Payer: Self-pay | Admitting: Allergy

## 2018-06-21 VITALS — BP 134/78 | HR 100 | Temp 98.4°F | Resp 18 | Ht 61.5 in | Wt 198.0 lb

## 2018-06-21 DIAGNOSIS — T7840XD Allergy, unspecified, subsequent encounter: Secondary | ICD-10-CM

## 2018-06-21 DIAGNOSIS — R21 Rash and other nonspecific skin eruption: Secondary | ICD-10-CM

## 2018-06-21 NOTE — Patient Instructions (Signed)
Get bloodwork  Schedule for cosentyx drug challenge. Must be off all allergy medications for 5 days before the visit. Start zyrtec 10mg  twice a day to help with the itching.  Follow up for cosentyx drug challenge    Skin care recommendations  Bath time: . Always use lukewarm water. AVOID very hot or cold water. Marland Kitchen Keep bathing time to 5-10 minutes. . Do NOT use bubble bath. . Use a mild soap and use just enough to wash the dirty areas. . Do NOT scrub skin vigorously.  . After bathing, pat dry your skin with a towel. Do NOT rub or scrub the skin.  Moisturizers and prescriptions:  . ALWAYS apply moisturizers immediately after bathing (within 3 minutes). This helps to lock-in moisture. . Use the moisturizer several times a day over the whole body. Kermit Balo summer moisturizers include: Aveeno, CeraVe, Cetaphil. Kermit Balo winter moisturizers include: Aquaphor, Vaseline, Cerave, Cetaphil, Eucerin, Vanicream. . When using moisturizers along with medications, the moisturizer should be applied about one hour after applying the medication to prevent diluting effect of the medication or moisturize around where you applied the medications. When not using medications, the moisturizer can be continued twice daily as maintenance.  Laundry and clothing: . Avoid laundry products with added color or perfumes. . Use unscented hypo-allergenic laundry products such as Tide free, Cheer free & gentle, and All free and clear.  . If the skin still seems dry or sensitive, you can try double-rinsing the clothes. . Avoid tight or scratchy clothing such as wool. . Do not use fabric softeners or dyer sheets.

## 2018-06-21 NOTE — Progress Notes (Signed)
New Patient Note  RE: Connie Jensen MRN: [id number] DOB: 1980-12-29 Date of Office Visit: 06/21/2018  Referring provider: Roderic Scarce* Primary care provider: Celene Squibb, MD  Chief Complaint: Allergic Reaction (8 treatments of Humira. she developed hives and large red/dry/scaly places from head to toe. she is also concerned about allergies to [place], epoxy. )  History of Present Illness: I had the pleasure of seeing Connie Jensen for initial evaluation at the [facility] on 06/22/2018. She is a 38 y.o. female, who is referred here by Celene Squibb, MD for the evaluation of allergic reactions. She is accompanied today by her mother who provided/contributed to the history.   Patient started on Humira on January 08, 2018 for her psoriasis. By the second dose her psoriasis completely resolved however on March 24, 2018 patient started to develop little white bumps on her hands and by the following week she had rashes that spread all throughout the body. Last Humira injection was on October 14th and noticed worsening symptoms afterwards. Describes the rash as pruritic and red. Patient has some mild finger swelling and noticed increased hair loss.  Patient was diagnosed with psoriasis about 15 years ago and was tried on multiple oral medications before starting on Humira. Since she has been off Humira the psoriasis has been flaring.  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 She was placed on triamcinolone 0.1% cream and had 2 courses of prednisone which did not help.   She was given a course of penicillin as there was a concern about potential skin infection which did not help.   Denies any changes in diet, personal care products around this time. Since then she has changed her products to free & clear with no  improvement. Does not eat red meat and no recent infections.  Currently on otezla for psoriasis which is not helping and she is prescribed to start cosentyx 2 injections every week for 5 weeks and then 2 injections every 4 weeks. She is hesitant about starting this injection because of what happened with Humira.   As a side note, patient started to make cups with epoxy in June 2019 as a hobby and she last used it about 6 weeks ago.   Cosentyx ingredients from PI: COSENTYX is a human interleukin-17A antagonist. What are the ingredients in COSENTYX? Active ingredient: secukinumab Inactive ingredients: Sensoready pen and prefilled syringe: L-histidine/histidine hydrochloride monohydrate, Lmethionine, polysorbate 80, trehalose dihydrate, and sterile water for injection. Vial: L-histidine/histidine hydrochloride monohydrate, polysorbate 80, and sucrose.  Humira information from PI: HUMIRA is a tumor necrosis factor (TNF) blocker. What are the ingredients in HUMIRA? Active ingredient: adalimumab Inactive ingredients: citric acid monohydrate, dibasic sodium phosphate dihydrate, mannitol, monobasic sodium phosphate dihydrate, polysorbate 80, sodium chloride, sodium citrate and Water for Injection. Sodium hydroxide is added as necessary to adjust pH. Inactive ingredients: mannitol, polysorbate 80, and Water for Injection.   Assessment and Plan: Loan is a 38 y.o. female with: Allergic reaction Possible reaction to Humira (treatment for her psoriasis) after 8 treatments in the form of persistent rash. 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 Last injection was on 03/24/2018 but still has persistent rash. Symptoms did not improve with oral prednisone. Interestingly she also  started to make cups with epoxy during these times but has not had any contact with it for the past 6 weeks.    Discussed at length with patient that she may have had a reaction to Humira and its components and there is no bloodwork to test for this. There have been some studies looking at skin testing which tests for IgE mediated reactions with some success.  There were also a few cases of Humira desensitization which have been successful in some patients. However, given clinical history I recommend that she avoid Humira for now.  There is a concern about starting another injectable Cosentyx for her psoriasis which has a different mechanism of action. Upon further reviewing ingredient lists from Humira and Cosentyx, recommend that she undergoes a drug challenge with Cosentyx in our office if the below bloodwork is all unremarkable. There is no good data on the validity of skin testing to Cosentyx.   Get bloodwork  Start zyrtec 10mg  twice a day to help with the itching.  Avoid epoxy as I believe this is flaring her rash on her hands. Consider patch testing in the future as well.   Rash and other nonspecific skin eruption See assessment and plan as above.  Discussed environmental control measures.   Return for Drug challenge.  Lab Orders     Tryptase     C3 and C4     Thyroid Cascade Profile     ANA w/Reflex     Comprehensive metabolic panel     CBC with Differential/Platelet     Urinalysis     Allergens w/Total IgE Area 2     Alpha-Gal Panel     Allergen Profile, Basic Food  Other allergy screening: Asthma: no Rhino conjunctivitis: no Food allergy: no Hymenoptera allergy: no Urticaria: no Eczema:no History of recurrent infections suggestive of immunodeficency: no  Diagnostics: None.  Past Medical History: Patient Active Problem List   Diagnosis Date Noted  . Allergic reaction 06/22/2018  . Rash and other nonspecific skin eruption 06/22/2018  . Irritable bowel 11/21/2011  . Groin strain 01/27/2011  . Back pain 01/27/2011  . CARCINOMA IN SITU, CERVIX 02/08/2010  .  UNSPECIFIED VITAMIN D DEFICIENCY 02/08/2010  . OBESITY 02/08/2010  . LEUKOCYTOSIS 02/08/2010  . CIGARETTE SMOKER 02/08/2010  . CAFFEINE EXCESS 02/08/2010  . DEPRESSION, MILD 02/08/2010  . RESTLESS LEG SYNDROME 02/08/2010  . COPD 02/08/2010   Past Medical History:  Diagnosis Date  . Anxiety   . 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 Urticaria    Past Surgical History: Past Surgical History:  Procedure Laterality Date  . CYST REMOVAL HAND    . MASS EXCISION Right 01/26/2016   Procedure: RIGHT WRIST EXCISION GANGLION;  Surgeon: Leanora Cover, MD;  Location: [facility];  Service: Orthopedics;  Laterality: Right;  . WISDOM TOOTH EXTRACTION     Medication List:  Current Outpatient Medications  Medication Sig Dispense Refill  . Apremilast (OTEZLA) 30 MG TABS Take 30 mg by mouth 2 (two) times daily.    . calcium-vitamin D (OSCAL WITH D) 500-200 MG-UNIT tablet Take 1 tablet by mouth.    . DULoxetine (CYMBALTA) 30 MG capsule Take 30 mg by mouth daily.    Marland Kitchen levonorgestrel-ethinyl estradiol (LARISSIA) 0.1-20 MG-MCG tablet Take 1 tablet by mouth daily.    Marland Kitchen zolpidem (AMBIEN) 5 MG tablet Take 10 mg by mouth at bedtime as needed.     No current facility-administered medications for this visit.    Allergies:  No Known Allergies Social History: Social History   Socioeconomic History  . Marital status: Single    Spouse name: Not on file  . Number of children: Not on file  . Years of education: Not on file  . Highest education level: Not on file  Occupational History  . Not on file  Social Needs  . Financial resource strain: Not on file  . Food insecurity:    Worry: Not on file    Inability: Not on file  . Transportation needs:    Medical: Not on file    Non-medical: Not on file  Tobacco Use  . Smoking status: Current Every Day Smoker    Packs/day: 1.00    Types: Cigarettes  . Smokeless tobacco: Never Used  Substance and Sexual Activity  . Alcohol use: No  . Drug  use: No  . Sexual activity: Not on file  Lifestyle  . Physical activity:    Days per week: Not on file    Minutes per session: Not on file  . Stress: Not on file  Relationships  . Social connections:    Talks on phone: Not on file    Gets together: Not on file    Attends religious service: Not on file    Active member of club or organization: Not on file    Attends meetings of clubs or organizations: Not on file    Relationship status: Not on file  Other Topics Concern  . Not on file  Social History Narrative  . Not on file   Lives in a 1995 log home. Smoking: 1 pack per day x 15 years Occupation: Armed forces training and education officer History: Water Damage/mildew in the house: no Carpet in the family room: no Carpet in the bedroom: no Heating: gas, electric, wood Cooling: central Pet: yes 1 dog x 16 yrs  Family History: History reviewed. No pertinent family history. Problem                               Relation Asthma                                   No  Eczema                                No  Food allergy                          No  Allergic rhino conjunctivitis     No   Review of Systems  Constitutional: Negative for appetite change, chills, fever and unexpected weight change.  HENT: Negative for congestion and rhinorrhea.   Eyes: Negative for itching.  Respiratory: Negative for cough, chest tightness, shortness of breath and wheezing.   Cardiovascular: Negative for chest pain.  Gastrointestinal: Negative for abdominal pain.  Genitourinary: Negative for difficulty urinating.  Skin: Positive for rash.  Allergic/Immunologic: Negative for environmental allergies and food allergies.  Neurological: Negative for headaches.   Objective: BP 134/78 (BP Location: Left Arm, Patient Position: Sitting, Cuff Size: Normal)   Pulse 100   Temp 98.4 F (36.9 C) (Oral)   Resp 18   Ht 5' 1.5" (1.562 m)   Wt 198 lb (89.8 kg)   SpO2 98%   BMI 36.81 kg/m  Body mass index is  36.81 kg/m. Physical Exam  Constitutional: She is oriented to person, place, and time. She appears well-developed and well-nourished.  HENT:  Head: Normocephalic and atraumatic.  Right Ear: External ear normal.  Left Ear: External ear normal.  Nose: Nose normal.  Mouth/Throat: Oropharynx is clear and moist.  Eyes: Conjunctivae and EOM are normal.  Neck: Neck supple.  Cardiovascular: Normal rate, regular rhythm and normal heart sounds. Exam reveals no gallop and no friction rub.  No murmur heard. Pulmonary/Chest: Effort normal and breath sounds normal. She has no wheezes. She has no rales.  Abdominal: Soft.  Lymphadenopathy:    She has no cervical adenopathy.  Neurological: She is alert and oriented to person, place, and time.  Skin: Skin is warm. Rash noted.  Diffuse, dry erythematous rash throughout the body. Some patches have whitish plaque. Hands are dry and peeling in some areas.   Psychiatric: She has a normal mood and affect. Her behavior is normal.  Nursing note and vitals reviewed.  The plan was reviewed with the patient/family, and all questions/concerned were addressed.  It was my pleasure to see Marytza today and participate in her care. Please feel free to contact me with any questions or concerns.  Sincerely,  Rexene Alberts, DO Allergy & Immunology  [facility] office: [phone] [facility] office: [phone]

## 2018-06-22 DIAGNOSIS — T7840XA Allergy, unspecified, initial encounter: Secondary | ICD-10-CM | POA: Insufficient documentation

## 2018-06-22 DIAGNOSIS — R21 Rash and other nonspecific skin eruption: Secondary | ICD-10-CM | POA: Insufficient documentation

## 2018-06-22 NOTE — Assessment & Plan Note (Addendum)
Possible reaction to Humira (treatment for her psoriasis) after 8 treatments in the form of persistent rash. 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 Last injection was on 03/24/2018 but still has persistent rash. Symptoms did not improve with oral prednisone. Interestingly she also started to make cups with epoxy during these times but has not had any contact with it for the past 6 weeks.   Discussed at length with patient that she may have had a reaction to Humira and its components and there is no bloodwork to test for this. There have been some studies looking at skin testing which tests for IgE mediated reactions with some success.  There were also a few cases of Humira desensitization which have been successful in some patients. However, given clinical history I recommend that she avoid Humira for now.  There is a concern about starting another injectable Cosentyx for her psoriasis which has a different mechanism of action. Upon further reviewing ingredient lists from Humira and Cosentyx, recommend that she undergoes a drug challenge with Cosentyx in our office if the below bloodwork is all unremarkable. There is no good data on the validity of skin testing to Cosentyx.   Get bloodwork  Start zyrtec 10mg  twice a day to help with the itching.  Avoid epoxy as I believe this is flaring her rash on her hands. Consider patch testing in the future as well.

## 2018-06-22 NOTE — Assessment & Plan Note (Signed)
See assessment and plan as above.  Discussed environmental control measures.

## 2018-06-25 DIAGNOSIS — L409 Psoriasis, unspecified: Secondary | ICD-10-CM | POA: Diagnosis not present

## 2018-06-25 DIAGNOSIS — M2550 Pain in unspecified joint: Secondary | ICD-10-CM | POA: Diagnosis not present

## 2018-06-27 ENCOUNTER — Encounter: Payer: Self-pay | Admitting: Allergy

## 2018-06-27 LAB — ALPHA-GAL PANEL
Alpha Gal IgE*: <0.1 kU/L (ref <0.10)
BEEF CLASS INTERPRETATION: 0
Class Interpretation: 0
Class Interpretation: 0
Pork (Sus spp) IgE: <0.1 kU/L (ref <0.35)

## 2018-06-27 LAB — URINALYSIS
Bilirubin, UA: NEGATIVE
GLUCOSE, UA: NEGATIVE
KETONES UA: NEGATIVE
LEUKOCYTES UA: NEGATIVE
Nitrite, UA: NEGATIVE
Protein, UA: NEGATIVE
RBC, UA: NEGATIVE
Specific Gravity, UA: 1.015 (ref 1.005–1.030)
Urobilinogen, Ur: 0.2 mg/dL (ref 0.2–1.0)
pH, UA: 6 (ref 5.0–7.5)

## 2018-06-27 LAB — C3 AND C4
COMPLEMENT C4, SERUM: 36 mg/dL (ref 14–44)
Complement C3, Serum: 136 mg/dL (ref 82–167)

## 2018-06-27 LAB — COMPREHENSIVE METABOLIC PANEL
A/G RATIO: 2.1 (ref 1.2–2.2)
ALBUMIN: 4.2 g/dL (ref 3.5–5.5)
ALT: 34 IU/L — ABNORMAL HIGH (ref 0–32)
AST: 20 IU/L (ref 0–40)
Alkaline Phosphatase: 88 IU/L (ref 39–117)
BUN / CREAT RATIO: 10 (ref 9–23)
BUN: 8 mg/dL (ref 6–20)
Bilirubin Total: <0.2 mg/dL (ref 0.0–1.2)
CALCIUM: 9.2 mg/dL (ref 8.7–10.2)
CO2: 22 mmol/L (ref 20–29)
CREATININE: 0.79 mg/dL (ref 0.57–1.00)
Chloride: 103 mmol/L (ref 96–106)
GFR calc Af Amer: 111 mL/min/{1.73_m2} (ref >59)
GFR, EST NON AFRICAN AMERICAN: 96 mL/min/{1.73_m2} (ref >59)
GLOBULIN, TOTAL: 2 g/dL (ref 1.5–4.5)
Glucose: 104 mg/dL — ABNORMAL HIGH (ref 65–99)
POTASSIUM: 4.1 mmol/L (ref 3.5–5.2)
SODIUM: 141 mmol/L (ref 134–144)
Total Protein: 6.2 g/dL (ref 6.0–8.5)

## 2018-06-27 LAB — ALLERGENS W/TOTAL IGE AREA 2
Aspergillus Fumigatus IgE: <0.1 kU/L
Cat Dander IgE: <0.1 kU/L
Cladosporium Herbarum IgE: <0.1 kU/L
Common Silver Birch IgE: <0.1 kU/L
Dog Dander IgE: <0.1 kU/L
Elm, American IgE: <0.1 kU/L
IgE (Immunoglobulin E), Serum: 39 IU/mL (ref 6–495)
Johnson Grass IgE: <0.1 kU/L
Mouse Urine IgE: <0.1 kU/L
Penicillium Chrysogen IgE: <0.1 kU/L
Sheep Sorrel IgE Qn: <0.1 kU/L
W001-IGE RAGWEED, SHORT: 0.15 kU/L — AB
White Mulberry IgE: <0.1 kU/L

## 2018-06-27 LAB — CBC WITH DIFFERENTIAL/PLATELET
BASOS: 1 %
Basophils Absolute: 0.1 10*3/uL (ref 0.0–0.2)
EOS (ABSOLUTE): 0.2 10*3/uL (ref 0.0–0.4)
EOS: 2 %
HEMATOCRIT: 39.5 % (ref 34.0–46.6)
Hemoglobin: 13.6 g/dL (ref 11.1–15.9)
Immature Grans (Abs): 0 10*3/uL (ref 0.0–0.1)
Immature Granulocytes: 0 %
LYMPHS ABS: 3.1 10*3/uL (ref 0.7–3.1)
Lymphs: 25 %
MCH: 30.4 pg (ref 26.6–33.0)
MCHC: 34.4 g/dL (ref 31.5–35.7)
MCV: 88 fL (ref 79–97)
MONOS ABS: 0.7 10*3/uL (ref 0.1–0.9)
Monocytes: 6 %
NEUTROS ABS: 8.4 10*3/uL — AB (ref 1.4–7.0)
NEUTROS PCT: 66 %
PLATELETS: 330 10*3/uL (ref 150–450)
RBC: 4.47 x10E6/uL (ref 3.77–5.28)
RDW: 12.1 % (ref 11.7–15.4)
WBC: 12.6 10*3/uL — ABNORMAL HIGH (ref 3.4–10.8)

## 2018-06-27 LAB — ALLERGEN PROFILE, BASIC FOOD
Allergen Corn, IgE: <0.1 kU/L
Beef IgE: <0.1 kU/L
Egg, Whole IgE: <0.1 kU/L
Food Mix (Seafoods) IgE: NEGATIVE
Milk IgE: <0.1 kU/L

## 2018-06-27 LAB — THYROID CASCADE PROFILE: TSH: 0.61 u[IU]/mL (ref 0.450–4.500)

## 2018-06-27 LAB — TRYPTASE: Tryptase: 4.7 ug/L (ref 2.2–13.2)

## 2018-06-27 LAB — ANA W/REFLEX: Anti Nuclear Antibody(ANA): NEGATIVE

## 2018-07-04 DIAGNOSIS — L409 Psoriasis, unspecified: Secondary | ICD-10-CM | POA: Diagnosis not present

## 2018-07-04 DIAGNOSIS — L4050 Arthropathic psoriasis, unspecified: Secondary | ICD-10-CM | POA: Diagnosis not present

## 2018-07-04 DIAGNOSIS — L400 Psoriasis vulgaris: Secondary | ICD-10-CM | POA: Diagnosis not present

## 2018-07-05 DIAGNOSIS — L4052 Psoriatic arthritis mutilans: Secondary | ICD-10-CM | POA: Diagnosis not present

## 2018-07-05 DIAGNOSIS — L401 Generalized pustular psoriasis: Secondary | ICD-10-CM | POA: Diagnosis not present

## 2018-07-05 DIAGNOSIS — R590 Localized enlarged lymph nodes: Secondary | ICD-10-CM | POA: Diagnosis not present

## 2018-07-23 ENCOUNTER — Encounter: Payer: 59 | Admitting: Allergy

## 2018-08-06 DIAGNOSIS — L4059 Other psoriatic arthropathy: Secondary | ICD-10-CM | POA: Diagnosis not present

## 2018-08-06 DIAGNOSIS — Z79899 Other long term (current) drug therapy: Secondary | ICD-10-CM | POA: Diagnosis not present

## 2018-08-06 DIAGNOSIS — L409 Psoriasis, unspecified: Secondary | ICD-10-CM | POA: Diagnosis not present

## 2018-08-08 DIAGNOSIS — L4050 Arthropathic psoriasis, unspecified: Secondary | ICD-10-CM | POA: Diagnosis not present

## 2018-08-08 DIAGNOSIS — L409 Psoriasis, unspecified: Secondary | ICD-10-CM | POA: Diagnosis not present

## 2018-08-08 DIAGNOSIS — L400 Psoriasis vulgaris: Secondary | ICD-10-CM | POA: Diagnosis not present

## 2018-09-24 DIAGNOSIS — L409 Psoriasis, unspecified: Secondary | ICD-10-CM | POA: Diagnosis not present

## 2018-09-24 DIAGNOSIS — L4050 Arthropathic psoriasis, unspecified: Secondary | ICD-10-CM | POA: Diagnosis not present

## 2018-09-24 DIAGNOSIS — L400 Psoriasis vulgaris: Secondary | ICD-10-CM | POA: Diagnosis not present

## 2018-10-08 ENCOUNTER — Encounter: Payer: Self-pay | Admitting: Allergy

## 2018-11-06 DIAGNOSIS — L409 Psoriasis, unspecified: Secondary | ICD-10-CM | POA: Diagnosis not present

## 2018-11-06 DIAGNOSIS — Z79899 Other long term (current) drug therapy: Secondary | ICD-10-CM | POA: Diagnosis not present

## 2018-11-06 DIAGNOSIS — L4059 Other psoriatic arthropathy: Secondary | ICD-10-CM | POA: Diagnosis not present

## 2018-11-20 ENCOUNTER — Other Ambulatory Visit: Payer: Self-pay | Admitting: Obstetrics and Gynecology

## 2018-11-20 DIAGNOSIS — E049 Nontoxic goiter, unspecified: Secondary | ICD-10-CM

## 2018-12-07 ENCOUNTER — Ambulatory Visit
Admission: RE | Admit: 2018-12-07 | Discharge: 2018-12-07 | Disposition: A | Discharge status: Home / Self Care | Payer: 59 | Source: Ambulatory Visit | Attending: Obstetrics and Gynecology | Admitting: Obstetrics and Gynecology

## 2018-12-07 DIAGNOSIS — E049 Nontoxic goiter, unspecified: Secondary | ICD-10-CM

## 2020-12-23 ENCOUNTER — Encounter: Payer: Self-pay | Admitting: Physician Assistant

## 2020-12-23 ENCOUNTER — Ambulatory Visit: Payer: 59 | Admitting: Physician Assistant

## 2020-12-23 ENCOUNTER — Other Ambulatory Visit: Payer: Self-pay

## 2020-12-23 DIAGNOSIS — L219 Seborrheic dermatitis, unspecified: Secondary | ICD-10-CM

## 2020-12-23 DIAGNOSIS — Z1283 Encounter for screening for malignant neoplasm of skin: Secondary | ICD-10-CM

## 2020-12-23 MED ORDER — ALCLOMETASONE DIPROPIONATE 0.05 % EX CREA: TOPICAL_CREAM | CUTANEOUS | 3 refills | Status: DC

## 2020-12-23 MED ORDER — KETOCONAZOLE 2 % EX CREA: TOPICAL_CREAM | CUTANEOUS | 3 refills | Status: DC

## 2020-12-23 NOTE — Progress Notes (Signed)
   Follow-Up Visit   Subjective  Connie Jensen is a 40 y.o. female who presents for the following: Annual Exam (Left upper thigh x 1 year new per patient, psoriasis controlled with cosentyx last injection x 2 weeks ago). Facial rash up to a year ago with significant scale on central face and ears. Itches. No treatment.   The following portions of the chart were reviewed this encounter and updated as appropriate:  Tobacco  Allergies  Meds  Problems  Med Hx  Surg Hx  Fam Hx       Objective  Well appearing patient in no apparent distress; mood and affect are within normal limits.  A full examination was performed including scalp, head, eyes, ears, nose, lips, neck, chest, axillae, abdomen, back, buttocks, bilateral upper extremities, bilateral lower extremities, hands, feet, fingers, toes, fingernails, and toenails. All findings within normal limits unless otherwise noted below.  Head to toe No atypical moles or skin cancer found today   paranasal, ears and forehead White scale on pink base.   Assessment & Plan  Screening exam for skin cancer Head to toe  Yearly skin exams  Seborrheic dermatitis paranasal, ears and forehead  alclomethasone (ACLOVATE) 0.05 % cream - paranasal, ears and forehead Apply to face qd-bid  ketoconazole (NIZORAL) 2 % cream - paranasal, ears and forehead Apply to face qd-bid   I, Gradie Ohm, PA-C, have reviewed all documentation's for this visit.  The documentation on 12/23/20 for the exam, diagnosis, procedures and orders are all accurate and complete.

## 2021-09-27 ENCOUNTER — Ambulatory Visit
Admission: RE | Admit: 2021-09-27 | Discharge: 2021-09-27 | Disposition: A | Discharge status: Home / Self Care | Payer: 59 | Source: Ambulatory Visit | Attending: Family Medicine | Admitting: Family Medicine

## 2021-09-27 VITALS — BP 123/84 | HR 95 | Temp 99.3°F | Resp 18

## 2021-09-27 DIAGNOSIS — R197 Diarrhea, unspecified: Secondary | ICD-10-CM | POA: Insufficient documentation

## 2021-09-27 MED ORDER — DICYCLOMINE HCL 20 MG PO TABS: 20.0000 mg | ORAL_TABLET | Freq: Three times a day (TID) | ORAL | 0 refills | Status: DC

## 2021-09-27 MED ORDER — CIPROFLOXACIN HCL 500 MG PO TABS: 500.0000 mg | ORAL_TABLET | Freq: Two times a day (BID) | ORAL | 0 refills | Status: DC

## 2021-09-27 NOTE — ED Triage Notes (Signed)
Pt presents with c/o abdominal pain and diarrhea for past 2 weeks, has been seen before in January by pcp and had stool samples that was normal  ?

## 2021-09-27 NOTE — ED Provider Notes (Signed)
?[place] ? ? ? ?CSN: [id number] ?Arrival date & time: 09/27/21  1340 ? ? ?  ? ?History   ?Chief Complaint ?Chief Complaint  ?Patient presents with  ? Abdominal Pain  ?  Entered by patient  ? Diarrhea  ? ? ?HPI ?Connie Jensen is a 41 y.o. female.  ? ?Presenting today with 2-week history of lower abdominal cramping, severe watery diarrhea that she states is 20-25 episodes per day and much worse with eating.  She denies fever, chills, bloody diarrhea, nausea, vomiting, new foods, new medications, recent travel, sick contacts.  She states a very similar incident happened in January, evaluated by her primary care provider.  States that stool studies were negative at this time and she was treated with a course of Cipro that did seem to resolve the issue.  She has a history of IBS diarrhea but states this feels very different.  Has been trying Imodium, probiotics and bland foods with no relief. ? ? ?Past Medical History:  ?Diagnosis Date  ? Anxiety   ? 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 Urticaria   ? ? ?Patient Active Problem List  ? Diagnosis Date Noted  ? Allergic reaction 06/22/2018  ? Rash and other nonspecific skin eruption 06/22/2018  ? Irritable bowel 11/21/2011  ? Groin strain 01/27/2011  ? Back pain 01/27/2011  ? CARCINOMA IN SITU, CERVIX 02/08/2010  ? UNSPECIFIED VITAMIN D DEFICIENCY 02/08/2010  ? OBESITY 02/08/2010  ? LEUKOCYTOSIS 02/08/2010  ? CIGARETTE SMOKER 02/08/2010  ? CAFFEINE EXCESS 02/08/2010  ? DEPRESSION, MILD 02/08/2010  ? RESTLESS LEG SYNDROME 02/08/2010  ? COPD 02/08/2010  ? ? ?Past Surgical History:  ?Procedure Laterality Date  ? CYST REMOVAL HAND    ? MASS EXCISION Right 01/26/2016  ? Procedure: RIGHT WRIST EXCISION GANGLION;  Surgeon: Leanora Cover, MD;  Location: [facility];  Service: Orthopedics;  Laterality: Right;  ? WISDOM TOOTH EXTRACTION    ? ? ?OB History   ?No obstetric history on file. ?  ? ? ? ?Home Medications   ? ?Prior to Admission medications    ?Medication Sig Start Date End Date Taking? Authorizing Provider  ?ciprofloxacin (CIPRO) 500 MG tablet Take 1 tablet (500 mg total) by mouth 2 (two) times daily. 09/27/21  Yes Volney American, PA-C  ?dicyclomine (BENTYL) 20 MG tablet Take 1 tablet (20 mg total) by mouth 3 (three) times daily before meals. Take as needed 09/27/21  Yes Volney American, PA-C  ?alclomethasone (ACLOVATE) 0.05 % cream Apply to face qd-bid 12/23/20   Warren Danes, PA-C  ?Apremilast 30 MG TABS Take 30 mg by mouth 2 (two) times daily. ?Patient not taking: Reported on 12/23/2020 09/05/16   [provider]  ?calcium-vitamin D (OSCAL WITH D) 500-200 MG-UNIT tablet Take 1 tablet by mouth. ?Patient not taking: Reported on 12/23/2020    [provider]  ?COSENTYX SENSOREADY, 300 MG, 150 MG/ML SOAJ Inject into the skin. 12/01/20   [provider]  ?diclofenac (VOLTAREN) 75 MG EC tablet Take by mouth. 10/13/20   [provider]  ?DULoxetine (CYMBALTA) 30 MG capsule Take 30 mg by mouth daily. ?Patient not taking: Reported on 12/23/2020    [provider]  ?ketoconazole (NIZORAL) 2 % cream Apply to face qd-bid 12/23/20   Warren Danes, PA-C  ?levonorgestrel-ethinyl estradiol (ALESSE) 0.1-20 MG-MCG tablet Take 1 tablet by mouth daily. ?Patient not taking: Reported on 12/23/2020    [provider]  ?zolpidem (AMBIEN) 5 MG  tablet Take 10 mg by mouth at bedtime as needed.    [provider]  ? ? ?Family History ?History reviewed. No pertinent family history. ? ?Social History ?Social History  ? ?Tobacco Use  ? Smoking status: Every Day  ?  Packs/day: 1.00  ?  Types: Cigarettes  ? Smokeless tobacco: Never  ?Vaping Use  ? Vaping Use: Never used  ?Substance Use Topics  ? Alcohol use: No  ? Drug use: No  ? ? ? ?Allergies   ?Humira [adalimumab] ? ? ?Review of Systems ?Review of Systems ?PER HPI ? ?Physical Exam ?Triage Vital Signs ?ED Triage Vitals  ?Enc Vitals Group  ?   BP 09/27/21  1419 123/84  ?   Pulse Rate 09/27/21 1419 95  ?   Resp 09/27/21 1419 18  ?   Temp 09/27/21 1419 99.3 ?F (37.4 ?C)  ?   Temp src --   ?   SpO2 09/27/21 1419 95 %  ?   Weight --   ?   Height --   ?   Head Circumference --   ?   Peak Flow --   ?   Pain Score 09/27/21 1417 4  ?   Pain Loc --   ?   Pain Edu? --   ?   Excl. in [place]? --   ? ?No data found. ? ?Updated Vital Signs ?BP 123/84   Pulse 95   Temp 99.3 ?F (37.4 ?C)   Resp 18   SpO2 95%  ? ?Visual Acuity ?Right Eye Distance:   ?Left Eye Distance:   ?Bilateral Distance:   ? ?Right Eye Near:   ?Left Eye Near:    ?Bilateral Near:    ? ?Physical Exam ?Vitals and nursing note reviewed.  ?Constitutional:   ?   Appearance: Normal appearance. She is not ill-appearing.  ?HENT:  ?   Head: Atraumatic.  ?Eyes:  ?   Extraocular Movements: Extraocular movements intact.  ?   Conjunctiva/sclera: Conjunctivae normal.  ?Cardiovascular:  ?   Rate and Rhythm: Normal rate and regular rhythm.  ?   Heart sounds: Normal heart sounds.  ?Pulmonary:  ?   Effort: Pulmonary effort is normal.  ?   Breath sounds: Normal breath sounds.  ?Abdominal:  ?   General: There is no distension.  ?   Palpations: Abdomen is soft. There is no mass.  ?   Tenderness: There is no abdominal tenderness. There is no right CVA tenderness, left CVA tenderness, guarding or rebound.  ?   Comments: Bowel sounds mildly hyperactive  ?Musculoskeletal:     ?   General: Normal range of motion.  ?   Cervical back: Normal range of motion and neck supple.  ?Skin: ?   General: Skin is warm and dry.  ?Neurological:  ?   Mental Status: She is alert and oriented to person, place, and time.  ?Psychiatric:     ?   Mood and Affect: Mood normal.     ?   Thought Content: Thought content normal.     ?   Judgment: Judgment normal.  ? ? ? ?UC Treatments / Results  ?Labs ?(all labs ordered are listed, but only abnormal results are displayed) ?Labs Reviewed  ?GASTROINTESTINAL PANEL BY PCR, STOOL (REPLACES STOOL CULTURE)  ?C DIFFICILE  QUICK SCREEN W PCR REFLEX    ? ? ?EKG ? ? ?Radiology ?No results found. ? ?Procedures ?Procedures (including critical care time) ? ?Medications Ordered in UC ?Medications - No  data to display ? ?Initial Impression / Assessment and Plan / UC Course  ?I have reviewed the triage vital signs and the nursing notes. ? ?Pertinent labs & imaging results that were available during my care of the patient were reviewed by me and considered in my medical decision making (see chart for details). ? ?  ? ?Vital signs and exam overall reassuring with no red flag findings, unclear if severe IBS presentation versus infectious cause.  GI panel and C. difficile panel pending, will trial Bentyl and another course of Cipro as this seemed to resolve the issue previously.  Recommended that she follow-up with PCP and GI specialist if not resolving.  Brat diet, fluids recommended.  ED for worsening symptoms at any time. ? ?Final Clinical Impressions(s) / UC Diagnoses  ? ?Final diagnoses:  ?Diarrhea, unspecified type  ? ?Discharge Instructions   ?None ?  ? ?ED Prescriptions   ? ? Medication Sig Dispense Auth. Provider  ? ciprofloxacin (CIPRO) 500 MG tablet Take 1 tablet (500 mg total) by mouth 2 (two) times daily. 14 tablet Volney American, [place]  ? dicyclomine (BENTYL) 20 MG tablet Take 1 tablet (20 mg total) by mouth 3 (three) times daily before meals. Take as needed 90 tablet Volney American, [place]  ? ?  ? ?PDMP not reviewed this encounter. ?  ?Volney American, PA-C ?09/27/21 1459 ? ?

## 2021-09-28 LAB — GASTROINTESTINAL PANEL BY PCR, STOOL (REPLACES STOOL CULTURE)

## 2021-09-28 LAB — C DIFFICILE QUICK SCREEN W PCR REFLEX
C Diff antigen: NEGATIVE
C Diff interpretation: NOT DETECTED
C Diff toxin: NEGATIVE

## 2021-11-16 ENCOUNTER — Ambulatory Visit (INDEPENDENT_AMBULATORY_CARE_PROVIDER_SITE_OTHER): Payer: 59 | Admitting: Gastroenterology

## 2021-11-16 ENCOUNTER — Encounter (INDEPENDENT_AMBULATORY_CARE_PROVIDER_SITE_OTHER): Payer: Self-pay | Admitting: Gastroenterology

## 2021-11-16 VITALS — BP 113/76 | HR 99 | Temp 98.6°F | Ht 62.0 in | Wt 185.7 lb

## 2021-11-16 DIAGNOSIS — R197 Diarrhea, unspecified: Secondary | ICD-10-CM | POA: Diagnosis not present

## 2021-11-16 DIAGNOSIS — Z862 Personal history of diseases of the blood and blood-forming organs and certain disorders involving the immune mechanism: Secondary | ICD-10-CM | POA: Diagnosis not present

## 2021-11-16 NOTE — H&P (View-Only) (Signed)
Referring Provider: Pablo Lawrence, NP Primary Care Physician:  Pablo Lawrence, NP Primary GI Physician: new  Chief Complaint  Patient presents with   Follow-up    Patient here today for a follow up on her recent visit to [facility] care on 09/27/2021 due to diarrhea. Patient states she is still having issues with having 10 loose bm's per day. Denies fever or bloody stools. She does have mid to lower left side abdominal pain. She has been taking Midol and it seems to help with the pain. She has tried over the counter Antidiarrheal with no help and probiotics also did not help. Dr.Beakman's needs to know today's diagnoses due to Cosentyx that she takes for RA.   HPI:   Connie Jensen is a 41 y.o. female with past medical history of anxiety, polyarticular psoriatic arthritis.  Patient presenting today as a new patient for diarrhea.   History: Patient presented to UC on 09/27/21 with 2 week history of lower abdominal cramping, watery diarrhea (20-25) episodes per day. Reported a similar incident in January with negative stool studies at that time, treated with course of Cipro that seemed to resolve the issue. She had repeat stool studies in April with negative C diff and GI path panel. She was given Rx Bentyl and empiric course of Cipro.  Present: Patient reports she continues to have diarrhea with 10-20 watery BMs per day. She states that symptoms started in January she was treated with cipro, flagyl and xyfaxan, symptoms did not improve, spontaneously they stopped in march for about 2 weeks and she was back at baseline with 1 formed BM per day, she began with watery diarrhea again in April which has continued. Seen at urgent care in mid april with negative stool studies, cipro and bentyl did not provide any results.  She has tried otc imodium, probiotics and active culture yogurts with no result. She denies rectal bleeding or melena. She has had no fevers, chills, nausea or vomiting to  accompany her diarrhea. She has some lower abdominal pain with the diarrhea. She does not eat a lot as this makes her diarrhea worse, usually occurring 2-3 hours after eating. She denies any unintentional weight loss. She notes some mucus like substance in her stools at times.  She has been on cosentyx x3 years for hx of polyarticular psoriatic arthritis and notes that in January she stopped responding to cosentyx, with current flare of her psoriasis, this was around the time she developed extensive diarrhea. Previously on Humira, however, switched to Consentyx in 2020 when psoriasis stopped responding to this. She has hx of IBS but feels that this is much different. She had labs done with her PCP a few weeks back.   NSAID SJG:GEZMO only on occasion Social hx: no etoh, 1/2 PPD  Fam hx:no IBD or CRC.  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     diclofenac (VOLTAREN) 75 MG EC tablet Take 75 mg by mouth daily at 6 (six) AM.     DULoxetine HCl 60 MG CSDR Take 30 mg by mouth daily.     ketoconazole (NIZORAL) 2 % cream Apply to face qd-bid 60 g 3   levonorgestrel-ethinyl estradiol (ALESSE) 0.1-20 MG-MCG tablet Take 1 tablet by mouth daily.     traZODone (DESYREL) 150 MG tablet 1 tablet at bedtime.     dicyclomine (BENTYL) 20 MG tablet Take 1 tablet (20 mg total) by mouth 3 (three)  times daily before meals. Take as needed (Patient not taking: Reported on 11/16/2021) 90 tablet 0   No current facility-administered medications for this visit.    Allergies as of 11/16/2021 - Review Complete 11/16/2021  Allergen Reaction Noted   Humira [adalimumab] Rash 12/23/2020    History reviewed. No pertinent family history.  Social History   Socioeconomic History   Marital status: Single    Spouse name: Not on file   Number of children: Not on file   Years of education: Not on file   Highest education level: Not on file  Occupational History   Not on file  Tobacco Use   Smoking status: Every Day    Packs/day: 1.00    Types: Cigarettes   Smokeless tobacco: Never  Vaping Use   Vaping Use: Never used  Substance and Sexual Activity   Alcohol use: No   Drug use: No   Sexual activity: Not on file  Other Topics Concern   Not on file  Social History Narrative   Not on file   Social Determinants of Health   Financial Resource Strain: Not on file  Food Insecurity: Not on file  Transportation Needs: Not on file  Physical Activity: Not on file  Stress: Not on file  Social Connections: Not on file   Review of systems General: negative for malaise, night sweats, fever, chills, weight loss Neck: Negative for lumps, goiter, pain and significant neck swelling Resp: Negative for cough, wheezing, dyspnea at rest CV: Negative for chest pain, leg swelling, palpitations, orthopnea GI: denies melena, hematochezia, nausea, vomiting,  constipation, dysphagia, odyonophagia, early satiety or unintentional weight loss. +diarrhea MSK: +joint pain/stiffness Derm: +psoriasis plaques Psych: Denies depression, anxiety, memory loss, confusion. No homicidal or suicidal ideation.  Heme: Negative for prolonged bleeding, bruising easily, and swollen nodes. Endocrine: Negative for cold or heat intolerance, polyuria, polydipsia and goiter. Neuro: negative for tremor, gait imbalance, syncope  and seizures. The remainder of the review of systems is noncontributory.  Physical Exam: BP 113/76 (BP Location: Left Arm, Patient Position: Sitting, Cuff Size: Large)   Pulse 99   Temp 98.6 F (37 C) (Oral)   Ht '5\' 2"'$  (1.575 m)   Wt 185 lb 11.2 oz (84.2 kg)   BMI 33.96 kg/m  General:   Alert and oriented. No distress noted. Pleasant and cooperative.  Head:  Normocephalic and atraumatic. Eyes:  Conjuctiva clear without scleral icterus. Mouth:  Oral mucosa pink and moist. Good dentition. No lesions. Heart: Normal rate and rhythm, s1 and s2 heart sounds present.  Lungs: Clear lung sounds in all lobes. Respirations equal and unlabored. Abdomen:  +BS, soft, non-tender and non-distended. No rebound or guarding. No HSM or masses noted. Derm: No palmar erythema or jaundice Msk:  Symmetrical without gross deformities. Normal posture. Extremities:  Without edema. Neurologic:  Alert and  oriented x4 Psych:  Alert and  cooperative. Normal mood and affect.  Invalid input(s): 6 MONTHS   ASSESSMENT: Connie Jensen is a 41 y.o. female presenting today as a new patient for diarrhea.  Patient with history of polyarticular psoriatic arthritis, currently maintained on Cosentyx since 2020, but notably with failed response to medication starting in January, around the same time she began having profuse, watery diarrhea 10-20 episodes per day. She has had multiple negative stool studies and empiric antibiotics to include cipro, flagyl and xifaxan without resolution of symptoms. She denies rectal bleeding or melena but has some presence of mucus in stools. Given her hx of polyarticular psoriatic arthritis she is at higher risk for presence of IBD, Cosentyx is also known to cause diarrhea and in rare cases induce IBD. With consideration of these risks factors, I recommend that we proceed with Colonoscopy for further evaluation of symptoms in order to rule out IBD as cause of her symptoms. Indications, risks  and benefits of procedure discussed in detail with patient. Patient verbalized understanding and is in agreement to proceed with Colonoscopy at this time.    PLAN:  Obtain labs from PCP  2. Schedule Colonoscopy-endo 1 3. Further recommendations to follow after endo evaluation  All questions were answered, patient verbalized understanding and is in agreement with plan as outlined above.    Follow Up: 3 months  Eschol Auxier L. Alver Sorrow, MSN, APRN, AGNP-C Adult-Gerontology Nurse Practitioner [facility]

## 2021-11-16 NOTE — Progress Notes (Signed)
Referring Provider: Pablo Lawrence, NP Primary Care Physician:  Pablo Lawrence, NP Primary GI Physician: new  Chief Complaint  Patient presents with   Follow-up    Patient here today for a follow up on her recent visit to [facility] Urgent care on 09/27/2021 due to diarrhea. Patient states she is still having issues with having 10 loose bm's per day. Denies fever or bloody stools. She does have mid to lower left side abdominal pain. She has been taking Midol and it seems to help with the pain. She has tried over the counter Antidiarrheal with no help and probiotics also did not help. Dr.Beakman's needs to know today's diagnoses due to Cosentyx that she takes for RA.   HPI:   Connie Jensen is a 41 y.o. female with past medical history of anxiety, polyarticular psoriatic arthritis.  Patient presenting today as a new patient for diarrhea.   History: Patient presented to UC on 09/27/21 with 2 week history of lower abdominal cramping, watery diarrhea (20-25) episodes per day. Reported a similar incident in January with negative stool studies at that time, treated with course of Cipro that seemed to resolve the issue. She had repeat stool studies in April with negative C diff and GI path panel. She was given Rx Bentyl and empiric course of Cipro.  Present: Patient reports she continues to have diarrhea with 10-20 watery BMs per day. She states that symptoms started in January she was treated with cipro, flagyl and xyfaxan, symptoms did not improve, spontaneously they stopped in march for about 2 weeks and she was back at baseline with 1 formed BM per day, she began with watery diarrhea again in April which has continued. Seen at urgent care in mid april with negative stool studies, cipro and bentyl did not provide any results.  She has tried otc imodium, probiotics and active culture yogurts with no result. She denies rectal bleeding or melena. She has had no fevers, chills, nausea or vomiting to  accompany her diarrhea. She has some lower abdominal pain with the diarrhea. She does not eat a lot as this makes her diarrhea worse, usually occurring 2-3 hours after eating. She denies any unintentional weight loss. She notes some mucus like substance in her stools at times.  She has been on cosentyx x3 years for hx of polyarticular psoriatic arthritis and notes that in January she stopped responding to cosentyx, with current flare of her psoriasis, this was around the time she developed extensive diarrhea. Previously on Humira, however, switched to Consentyx in 2020 when psoriasis stopped responding to this. She has hx of IBS but feels that this is much different. She had labs done with her PCP a few weeks back.   NSAID TFT:DDUKG only on occasion Social hx: no etoh, 1/2 PPD  Fam hx:no IBD or CRC.  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     diclofenac (VOLTAREN) 75 MG EC tablet Take 75 mg by mouth daily at 6 (six) AM.     DULoxetine HCl 60 MG CSDR Take 30 mg by mouth daily.     ketoconazole (NIZORAL) 2 % cream Apply to face qd-bid 60 g 3   levonorgestrel-ethinyl estradiol (ALESSE) 0.1-20 MG-MCG tablet Take 1 tablet by mouth daily.     traZODone (DESYREL) 150 MG tablet 1 tablet at bedtime.     dicyclomine (BENTYL) 20 MG tablet Take 1 tablet (20 mg total) by mouth 3 (three)  times daily before meals. Take as needed (Patient not taking: Reported on 11/16/2021) 90 tablet 0   No current facility-administered medications for this visit.    Allergies as of 11/16/2021 - Review Complete 11/16/2021  Allergen Reaction Noted   Humira [adalimumab] Rash 12/23/2020    History reviewed. No pertinent family history.  Social History   Socioeconomic History   Marital status: Single    Spouse name: Not on file   Number of children: Not on file   Years of education: Not on file   Highest education level: Not on file  Occupational History   Not on file  Tobacco Use   Smoking status: Every Day    Packs/day: 1.00    Types: Cigarettes   Smokeless tobacco: Never  Vaping Use   Vaping Use: Never used  Substance and Sexual Activity   Alcohol use: No   Drug use: No   Sexual activity: Not on file  Other Topics Concern   Not on file  Social History Narrative   Not on file   Social Determinants of Health   Financial Resource Strain: Not on file  Food Insecurity: Not on file  Transportation Needs: Not on file  Physical Activity: Not on file  Stress: Not on file  Social Connections: Not on file   Review of systems General: negative for malaise, night sweats, fever, chills, weight loss Neck: Negative for lumps, goiter, pain and significant neck swelling Resp: Negative for cough, wheezing, dyspnea at rest CV: Negative for chest pain, leg swelling, palpitations, orthopnea GI: denies melena, hematochezia, nausea, vomiting,  constipation, dysphagia, odyonophagia, early satiety or unintentional weight loss. +diarrhea MSK: +joint pain/stiffness Derm: +psoriasis plaques Psych: Denies depression, anxiety, memory loss, confusion. No homicidal or suicidal ideation.  Heme: Negative for prolonged bleeding, bruising easily, and swollen nodes. Endocrine: Negative for cold or heat intolerance, polyuria, polydipsia and goiter. Neuro: negative for tremor, gait imbalance, syncope  and seizures. The remainder of the review of systems is noncontributory.  Physical Exam: BP 113/76 (BP Location: Left Arm, Patient Position: Sitting, Cuff Size: Large)   Pulse 99   Temp 98.6 F (37 C) (Oral)   Ht '5\' 2"'$  (1.575 m)   Wt 185 lb 11.2 oz (84.2 kg)   BMI 33.96 kg/m  General:   Alert and oriented. No distress noted. Pleasant and cooperative.  Head:  Normocephalic and atraumatic. Eyes:  Conjuctiva clear without scleral icterus. Mouth:  Oral mucosa pink and moist. Good dentition. No lesions. Heart: Normal rate and rhythm, s1 and s2 heart sounds present.  Lungs: Clear lung sounds in all lobes. Respirations equal and unlabored. Abdomen:  +BS, soft, non-tender and non-distended. No rebound or guarding. No HSM or masses noted. Derm: No palmar erythema or jaundice Msk:  Symmetrical without gross deformities. Normal posture. Extremities:  Without edema. Neurologic:  Alert and  oriented x4 Psych:  Alert and  cooperative. Normal mood and affect.  Invalid input(s): 6 MONTHS   ASSESSMENT: Connie Jensen is a 41 y.o. female presenting today as a new patient for diarrhea.  Patient with history of polyarticular psoriatic arthritis, currently maintained on Cosentyx since 2020, but notably with failed response to medication starting in January, around the same time she began having profuse, watery diarrhea 10-20 episodes per day. She has had multiple negative stool studies and empiric antibiotics to include cipro, flagyl and xifaxan without resolution of symptoms. She denies rectal bleeding or melena but has some presence of mucus in stools. Given her hx of polyarticular psoriatic arthritis she is at higher risk for presence of IBD, Cosentyx is also known to cause diarrhea and in rare cases induce IBD. With consideration of these risks factors, I recommend that we proceed with Colonoscopy for further evaluation of symptoms in order to rule out IBD as cause of her symptoms. Indications, risks  and benefits of procedure discussed in detail with patient. Patient verbalized understanding and is in agreement to proceed with Colonoscopy at this time.    PLAN:  Obtain labs from PCP  2. Schedule Colonoscopy-endo 1 3. Further recommendations to follow after endo evaluation  All questions were answered, patient verbalized understanding and is in agreement with plan as outlined above.    Follow Up: 3 months  Aariya Ferrick L. Alver Sorrow, MSN, APRN, AGNP-C Adult-Gerontology Nurse Practitioner [facility]

## 2021-11-16 NOTE — Patient Instructions (Signed)
I will obtain your most recent labs from PCP We will get you scheduled for colonoscopy for further evaluation of your symptoms as I want to make sure we are not dealing with an inflammatory bowel disease given that you have a history of psoriasis and have been on cosentyx.  Follow up in 3 months

## 2021-11-17 ENCOUNTER — Encounter (INDEPENDENT_AMBULATORY_CARE_PROVIDER_SITE_OTHER): Payer: Self-pay

## 2021-11-17 ENCOUNTER — Telehealth: Payer: Self-pay | Admitting: *Deleted

## 2021-11-17 ENCOUNTER — Other Ambulatory Visit (INDEPENDENT_AMBULATORY_CARE_PROVIDER_SITE_OTHER): Payer: Self-pay

## 2021-11-17 ENCOUNTER — Telehealth (INDEPENDENT_AMBULATORY_CARE_PROVIDER_SITE_OTHER): Payer: Self-pay

## 2021-11-17 DIAGNOSIS — Z01812 Encounter for preprocedural laboratory examination: Secondary | ICD-10-CM

## 2021-11-17 DIAGNOSIS — R197 Diarrhea, unspecified: Secondary | ICD-10-CM

## 2021-11-17 MED ORDER — NA SULFATE-K SULFATE-MG SULF 17.5-3.13-1.6 GM/177ML PO SOLN: 1.0000 | Freq: Once | ORAL | 0 refills | Status: AC

## 2021-11-17 NOTE — Telephone Encounter (Signed)
Miyeko Mahlum Ann Khyleigh Furney, CMA  ?

## 2021-11-17 NOTE — Telephone Encounter (Signed)
Connie Jensen wanted her office note sent to pt's Rheumatologist - Dr. Amil Amen

## 2021-11-18 NOTE — Telephone Encounter (Signed)
Note faxed to Dr Amil Amen

## 2021-11-26 NOTE — Patient Instructions (Signed)
Connie Jensen  11/26/2021     '@PREFPERIOPPHARMACY'$ @   Your procedure is scheduled on 12/01/2021.  Report to [facility] at 12:15 A.M.  Call this number if you have problems the morning of surgery:  [phone]   Remember:   Please follow the diet and prep instructions given to you by Dr Colman Cater office     Take these medicines the morning of surgery with A SIP OF WATER : none    Do not wear jewelry, make-up or nail polish.  Do not wear lotions, powders, or perfumes, or deodorant.  Do not shave 48 hours prior to surgery.  Men may shave face and neck.  Do not bring valuables to the hospital.  [facility] is not responsible for any belongings or valuables.  Contacts, dentures or bridgework may not be worn into surgery.  Leave your suitcase in the car.  After surgery it may be brought to your room.  For patients admitted to the hospital, discharge time will be determined by your treatment team.  Patients discharged the day of surgery will not be allowed to drive home.   Name and phone number of your driver:   Family Special instructions:  N/A  Please read over the following fact sheets that you were given. Care and Recovery After Surgery  Colonoscopy, Adult A colonoscopy is a procedure to look at the entire large intestine. This procedure is done using a long, thin, flexible tube that has a camera on the end. You may have a colonoscopy: As a part of normal colorectal screening. If you have certain symptoms, such as: A low number of red blood cells in your blood (anemia). Diarrhea that does not go away. Pain in your abdomen. Blood in your stool. A colonoscopy can help screen for and diagnose medical problems, including: An abnormal growth of cells or tissue (tumor). Abnormal growths within the lining of your intestine (polyps). Inflammation. Areas of bleeding. Tell your health care provider about: Any allergies you have. All medicines you are taking, including  vitamins, herbs, eye drops, creams, and over-the-counter medicines. Any problems you or family members have had with anesthetic medicines. Any bleeding problems you have. Any surgeries you have had. Any medical conditions you have. Any problems you have had with having bowel movements. Whether you are pregnant or may be pregnant. What are the risks? Generally, this is a safe procedure. However, problems may occur, including: Bleeding. Damage to your intestine. Allergic reactions to medicines given during the procedure. Infection. This is rare. What happens before the procedure? Eating and drinking restrictions Follow instructions from your health care provider about eating or drinking restrictions, which may include: A few days before the procedure: Follow a low-fiber diet. Avoid nuts, seeds, dried fruit, raw fruits, and vegetables. 1-3 days before the procedure: Eat only gelatin dessert or ice pops. Drink only clear liquids, such as water, clear juice, clear broth or bouillon, black coffee or tea, or clear soft drinks or sports drinks. Avoid liquids that contain red or purple dye. The day of the procedure: Do not eat solid foods. You may continue to drink clear liquids until up to 2 hours before the procedure. Do not eat or drink anything starting 2 hours before the procedure, or within the time period that your health care provider recommends. Bowel prep If you were prescribed a bowel prep to take by mouth (orally) to clean out your colon: Take it as told by your health care provider. Starting the day  before your procedure, you will need to drink a large amount of liquid medicine. The liquid will cause you to have many bowel movements of loose stool until your stool becomes almost clear or light green. If your skin or the opening between the buttocks (anus) gets irritated from diarrhea, you may relieve the irritation using: Wipes with medicine in them, such as adult wet wipes with  aloe and vitamin E. A product to soothe skin, such as petroleum jelly. If you vomit while drinking the bowel prep: Take a break for up to 60 minutes. Begin the bowel prep again. Call your health care provider if you keep vomiting or you cannot take the bowel prep without vomiting. To clean out your colon, you may also be given: Laxative medicines. These help you have a bowel movement. Instructions for enema use. An enema is liquid medicine injected into your rectum. Medicines Ask your health care provider about: Changing or stopping your regular medicines or supplements. This is especially important if you are taking iron supplements, diabetes medicines, or blood thinners. Taking medicines such as aspirin and ibuprofen. These medicines can thin your blood. Do not take these medicines unless your health care provider tells you to take them. Taking over-the-counter medicines, vitamins, herbs, and supplements. General instructions Ask your health care provider what steps will be taken to help prevent infection. These may include washing skin with a germ-killing soap. If you will be going home right after the procedure, plan to have a responsible adult: Take you home from the hospital or clinic. You will not be allowed to drive. Care for you for the time you are told. What happens during the procedure?  An IV will be inserted into one of your veins. You will be given a medicine to make you fall asleep (general anesthetic). You will lie on your side with your knees bent. A lubricant will be put on the tube. Then the tube will be: Inserted into your anus. Gently eased through all parts of your large intestine. Air will be sent into your colon to keep it open. This may cause some pressure or cramping. Images will be taken with the camera and will appear on a screen. A small tissue sample may be removed to be looked at under a microscope (biopsy). The tissue may be sent to a lab for testing if  any signs of problems are found. If small polyps are found, they may be removed and checked for cancer cells. When the procedure is finished, the tube will be removed. The procedure may vary among health care providers and hospitals. What happens after the procedure? Your blood pressure, heart rate, breathing rate, and blood oxygen level will be monitored until you leave the hospital or clinic. You may have a small amount of blood in your stool. You may pass gas and have mild cramping or bloating in your abdomen. This is caused by the air that was used to open your colon during the exam. If you were given a sedative during the procedure, it can affect you for several hours. Do not drive or operate machinery until your health care provider says that it is safe. It is up to you to get the results of your procedure. Ask your health care provider, or the department that is doing the procedure, when your results will be ready. Summary A colonoscopy is a procedure to look at the entire large intestine. Follow instructions from your health care provider about eating and drinking before the  procedure. If you were prescribed an oral bowel prep to clean out your colon, take it as told by your health care provider. During the colonoscopy, a flexible tube with a camera on its end is inserted into the anus and then passed into all parts of the large intestine. This information is not intended to replace advice given to you by your health care provider. Make sure you discuss any questions you have with your health care provider. Document Revised: 05/24/2021 Document Reviewed: 01/20/2021 Elsevier Patient Education  [place] Anesthesia refers to techniques, procedures, and medicines that help a person stay safe and comfortable during a medical or dental procedure. Monitored anesthesia care, or sedation, is one type of anesthesia. Your anesthesia specialist may recommend  sedation if you will be having a procedure that does not require you to be unconscious. You may have this procedure for: Cataract surgery. A dental procedure. A biopsy. A colonoscopy. During the procedure, you may receive a medicine to help you relax (sedative). There are three levels of sedation: Mild sedation. At this level, you may feel awake and relaxed. You will be able to follow directions. Moderate sedation. At this level, you will be sleepy. You may not remember the procedure. Deep sedation. At this level, you will be asleep. You will not remember the procedure. The more medicine you are given, the deeper your level of sedation will be. Depending on how you respond to the procedure, the anesthesia specialist may change your level of sedation or the type of anesthesia to fit your needs. An anesthesia specialist will monitor you closely during the procedure. Tell a health care provider about: Any allergies you have. All medicines you are taking, including vitamins, herbs, eye drops, creams, and over-the-counter medicines. Any problems you or family members have had with anesthetic medicines. Any blood disorders you have. Any surgeries you have had. Any medical conditions you have, such as sleep apnea. Whether you are pregnant or may be pregnant. Whether you use cigarettes, alcohol, or drugs. Any use of steroids, whether by mouth or as a cream. What are the risks? Generally, this is a safe procedure. However, problems may occur, including: Getting too much medicine (oversedation). Nausea. Allergic reaction to medicines. Trouble breathing. If this happens, a breathing tube may be used to help with breathing. It will be removed when you are awake and breathing on your own. Heart trouble. Lung trouble. Confusion that gets better with time (emergence delirium). What happens before the procedure? Staying hydrated Follow instructions from your health care provider about hydration, which  may include: Up to 2 hours before the procedure - you may continue to drink clear liquids, such as water, clear fruit juice, black coffee, and plain tea. Eating and drinking restrictions Follow instructions from your health care provider about eating and drinking, which may include: 8 hours before the procedure - stop eating heavy meals or foods, such as meat, fried foods, or fatty foods. 6 hours before the procedure - stop eating light meals or foods, such as toast or cereal. 6 hours before the procedure - stop drinking milk or drinks that contain milk. 2 hours before the procedure - stop drinking clear liquids. Medicines Ask your health care provider about: Changing or stopping your regular medicines. This is especially important if you are taking diabetes medicines or blood thinners. Taking medicines such as aspirin and ibuprofen. These medicines can thin your blood. Do not take these medicines unless your health care provider  tells you to take them. Taking over-the-counter medicines, vitamins, herbs, and supplements. Tests and exams You will have a physical exam. You may have blood tests done to show: How well your kidneys and liver are working. How well your blood can clot. General instructions Plan to have a responsible adult take you home from the hospital or clinic. If you will be going home right after the procedure, plan to have a responsible adult care for you for the time you are told. This is important. What happens during the procedure?  Your blood pressure, heart rate, breathing, level of pain, and overall condition will be monitored. An IV will be inserted into one of your veins. You will be given medicines as needed to keep you comfortable during the procedure. This may mean changing the level of sedation. Depending on your age or the procedure, the sedative may be given: As a pill that you will swallow or as a pill that is inserted into the rectum. As an injection into  the vein or muscle. As a spray through the nose. The procedure will be performed. Your breathing, heart rate, and blood pressure will be monitored during the procedure. When the procedure is over, the medicine will be stopped. The procedure may vary among health care providers and hospitals. What happens after the procedure? Your blood pressure, heart rate, breathing rate, and blood oxygen level will be monitored until you leave the hospital or clinic. You may feel sleepy, clumsy, or nauseous. You may feel forgetful about what happened after the procedure. You may vomit. You may continue to get IV fluids. Do not drive or operate machinery until your health care provider says that it is safe. Summary Monitored anesthesia care is used to keep a patient comfortable during short procedures. Tell your health care provider about any allergies or health conditions you have and about all the medicines you are taking. Before the procedure, follow instructions about when to stop eating and drinking and about changing or stopping any medicines. Your blood pressure, heart rate, breathing rate, and blood oxygen level will be monitored until you leave the hospital or clinic. Plan to have a responsible adult take you home from the hospital or clinic. This information is not intended to replace advice given to you by your health care provider. Make sure you discuss any questions you have with your health care provider. Document Revised: 05/04/2021 Document Reviewed: 05/02/2019 Elsevier Patient Education  [place].

## 2021-11-29 ENCOUNTER — Other Ambulatory Visit: Payer: Self-pay

## 2021-11-29 ENCOUNTER — Encounter (HOSPITAL_COMMUNITY)
Admission: RE | Admit: 2021-11-29 | Discharge: 2021-11-29 | Disposition: A | Discharge status: Home / Self Care | Payer: 59 | Source: Ambulatory Visit | Attending: Gastroenterology | Admitting: Gastroenterology

## 2021-11-29 ENCOUNTER — Other Ambulatory Visit (HOSPITAL_COMMUNITY)
Admission: RE | Admit: 2021-11-29 | Discharge: 2021-11-29 | Disposition: A | Discharge status: Home / Self Care | Payer: 59 | Source: Ambulatory Visit | Attending: Gastroenterology | Admitting: Gastroenterology

## 2021-11-29 ENCOUNTER — Encounter (HOSPITAL_COMMUNITY): Payer: Self-pay

## 2021-11-29 VITALS — Ht 62.0 in | Wt 185.7 lb

## 2021-11-29 DIAGNOSIS — Z01812 Encounter for preprocedural laboratory examination: Secondary | ICD-10-CM | POA: Diagnosis present

## 2021-11-29 DIAGNOSIS — Z01818 Encounter for other preprocedural examination: Secondary | ICD-10-CM

## 2021-11-29 LAB — PREGNANCY, URINE: Preg Test, Ur: NEGATIVE

## 2021-11-30 NOTE — Telephone Encounter (Signed)
error 

## 2021-12-01 ENCOUNTER — Ambulatory Visit (HOSPITAL_COMMUNITY)
Admission: RE | Admit: 2021-12-01 | Discharge: 2021-12-01 | Disposition: A | Discharge status: Home / Self Care | Payer: 59 | Attending: Gastroenterology | Admitting: Gastroenterology

## 2021-12-01 ENCOUNTER — Ambulatory Visit (HOSPITAL_COMMUNITY): Payer: 59 | Admitting: Anesthesiology

## 2021-12-01 ENCOUNTER — Ambulatory Visit (HOSPITAL_BASED_OUTPATIENT_CLINIC_OR_DEPARTMENT_OTHER): Payer: 59 | Admitting: Anesthesiology

## 2021-12-01 ENCOUNTER — Encounter (HOSPITAL_COMMUNITY)
Admission: RE | Disposition: A | Discharge status: Home / Self Care | Payer: Self-pay | Source: Home / Self Care | Attending: Gastroenterology

## 2021-12-01 ENCOUNTER — Encounter (HOSPITAL_COMMUNITY): Payer: Self-pay | Admitting: Gastroenterology

## 2021-12-01 DIAGNOSIS — J449 Chronic obstructive pulmonary disease, unspecified: Secondary | ICD-10-CM | POA: Insufficient documentation

## 2021-12-01 DIAGNOSIS — G2581 Restless legs syndrome: Secondary | ICD-10-CM | POA: Insufficient documentation

## 2021-12-01 DIAGNOSIS — F17200 Nicotine dependence, unspecified, uncomplicated: Secondary | ICD-10-CM

## 2021-12-01 DIAGNOSIS — F419 Anxiety disorder, unspecified: Secondary | ICD-10-CM | POA: Diagnosis not present

## 2021-12-01 DIAGNOSIS — Z79899 Other long term (current) drug therapy: Secondary | ICD-10-CM | POA: Diagnosis not present

## 2021-12-01 DIAGNOSIS — R197 Diarrhea, unspecified: Secondary | ICD-10-CM

## 2021-12-01 DIAGNOSIS — K648 Other hemorrhoids: Secondary | ICD-10-CM

## 2021-12-01 DIAGNOSIS — K529 Noninfective gastroenteritis and colitis, unspecified: Secondary | ICD-10-CM

## 2021-12-01 DIAGNOSIS — D069 Carcinoma in situ of cervix, unspecified: Secondary | ICD-10-CM

## 2021-12-01 DIAGNOSIS — L4059 Other psoriatic arthropathy: Secondary | ICD-10-CM | POA: Diagnosis not present

## 2021-12-01 DIAGNOSIS — K589 Irritable bowel syndrome without diarrhea: Secondary | ICD-10-CM

## 2021-12-01 DIAGNOSIS — E669 Obesity, unspecified: Secondary | ICD-10-CM

## 2021-12-01 DIAGNOSIS — F17210 Nicotine dependence, cigarettes, uncomplicated: Secondary | ICD-10-CM | POA: Diagnosis not present

## 2021-12-01 DIAGNOSIS — S76219A Strain of adductor muscle, fascia and tendon of unspecified thigh, initial encounter: Secondary | ICD-10-CM

## 2021-12-01 DIAGNOSIS — M549 Dorsalgia, unspecified: Secondary | ICD-10-CM

## 2021-12-01 DIAGNOSIS — Z862 Personal history of diseases of the blood and blood-forming organs and certain disorders involving the immune mechanism: Secondary | ICD-10-CM

## 2021-12-01 DIAGNOSIS — R21 Rash and other nonspecific skin eruption: Secondary | ICD-10-CM

## 2021-12-01 DIAGNOSIS — T7840XA Allergy, unspecified, initial encounter: Secondary | ICD-10-CM

## 2021-12-01 HISTORY — PX: BIOPSY: SHX5522

## 2021-12-01 HISTORY — PX: COLONOSCOPY WITH PROPOFOL: SHX5780

## 2021-12-01 LAB — HM COLONOSCOPY

## 2021-12-01 SURGERY — COLONOSCOPY WITH PROPOFOL
Anesthesia: General

## 2021-12-01 MED ORDER — PROPOFOL 500 MG/50ML IV EMUL
INTRAVENOUS | Status: DC | PRN
  Administered 2021-12-01: 150 ug/kg/min via INTRAVENOUS

## 2021-12-01 MED ORDER — STERILE WATER FOR IRRIGATION IR SOLN
Status: DC | PRN
  Administered 2021-12-01: .6 mL

## 2021-12-01 MED ORDER — LACTATED RINGERS IV SOLN
INTRAVENOUS | Status: DC
  Administered 2021-12-01: 1000 mL via INTRAVENOUS

## 2021-12-01 MED ORDER — PROPOFOL 10 MG/ML IV BOLUS
INTRAVENOUS | Status: DC | PRN
  Administered 2021-12-01 (×2): 20 mg via INTRAVENOUS

## 2021-12-01 NOTE — Op Note (Signed)
[facility] Patient Name: Connie Jensen Procedure Date: 12/01/2021 1:19 PM MRN: [id number] Date of Birth: 1981/01/06 Attending MD: Maylon Peppers ,  CSN: [id number] Age: 41 Admit Type: Outpatient Procedure:                Colonoscopy Indications:              Chronic diarrhea Providers:                Maylon Peppers, Janeece Riggers, RN, Caprice Kluver Referring MD:              Medicines:                Monitored Anesthesia Care Complications:            No immediate complications. Estimated Blood Loss:     Estimated blood loss: none. Procedure:                Pre-Anesthesia Assessment:                           - Prior to the procedure, a History and Physical                            was performed, and patient medications, allergies                            and sensitivities were reviewed. The patient's                            tolerance of previous anesthesia was reviewed.                           - The risks and benefits of the procedure and the                            sedation options and risks were discussed with the                            patient. All questions were answered and informed                            consent was obtained.                           - ASA Grade Assessment: II - A patient with mild                            systemic disease.                           After obtaining informed consent, the colonoscope                            was passed under direct vision. Throughout the                            procedure, the patient's blood pressure, pulse, and  oxygen saturations were monitored continuously. The                            [id number]-HQ190L ([id number]) scope was introduced through                            the anus and advanced to the the terminal ileum.                            The colonoscopy was performed without difficulty.                            The patient tolerated the procedure well. The                             quality of the bowel preparation was excellent. Scope In: 1:27:54 PM Scope Out: 1:42:32 PM Scope Withdrawal Time: 0 hours 12 minutes 41 seconds  Total Procedure Duration: 0 hours 14 minutes 38 seconds  Findings:      The perianal and digital rectal examinations were normal.      The terminal ileum appeared normal. Biopsies were taken with a cold       forceps for histology.      The colon (entire examined portion) appeared normal. Biopsies were taken       with a cold forceps for histology.      Non-bleeding internal hemorrhoids were found during retroflexion. The       hemorrhoids were small. Impression:               - The examined portion of the ileum was normal.                            Biopsied.                           - The entire examined colon is normal. Biopsied.                           - Non-bleeding internal hemorrhoids. Moderate Sedation:      Per Anesthesia Care Recommendation:           - Discharge patient to home (ambulatory).                           - Resume previous diet.                           - Await pathology results.                           - Repeat colonoscopy in 5 years for screening                            purposes. Procedure Code(s):        --- Professional ---                           [phone], Colonoscopy, flexible; with  biopsy, single                            or multiple Diagnosis Code(s):        --- Professional ---                           K64.8, Other hemorrhoids                           K52.9, Noninfective gastroenteritis and colitis,                            unspecified CPT copyright 2019 American Medical Association. All rights reserved. The codes documented in this report are preliminary and upon coder review may  be revised to meet current compliance requirements. Maylon Peppers, MD Maylon Peppers,  12/01/2021 1:46:37 PM This report has been signed electronically. Number of Addenda: 0

## 2021-12-01 NOTE — Anesthesia Postprocedure Evaluation (Signed)
Anesthesia Post Note  Patient: Connie Jensen  Procedure(s) Performed: COLONOSCOPY WITH PROPOFOL BIOPSY  Patient location during evaluation: Phase II Anesthesia Type: General Level of consciousness: awake and alert and oriented Pain management: pain level controlled Vital Signs Assessment: post-procedure vital signs reviewed and stable Respiratory status: spontaneous breathing, nonlabored ventilation and respiratory function stable Cardiovascular status: blood pressure returned to baseline and stable Postop Assessment: no apparent nausea or vomiting Anesthetic complications: no   No notable events documented.   Last Vitals:  Vitals:   12/01/21 1242 12/01/21 1349  BP: 131/81 (!) 106/54  Pulse:  87  Resp: (!) 24 17  Temp: 36.9 C (!) 36.4 C  SpO2: 98% 99%    Last Pain:  Vitals:   12/01/21 1349  TempSrc: Oral  PainSc:                  Zayden Hahne C Liridona Mashaw

## 2021-12-01 NOTE — Anesthesia Preprocedure Evaluation (Signed)
Anesthesia Evaluation  Patient identified by MRN, date of birth, ID band Patient awake    Reviewed: Allergy & Precautions, NPO status , Patient's Chart, lab work & pertinent test results  Airway Mallampati: II  TM Distance: >3 FB Neck ROM: Full    Dental  (+) Dental Advisory Given, Teeth Intact   Pulmonary COPD, Current Smoker and Patient abstained from smoking.,    Pulmonary exam normal breath sounds clear to auscultation       Cardiovascular negative cardio ROS Normal cardiovascular exam Rhythm:Regular Rate:Normal     Neuro/Psych PSYCHIATRIC DISORDERS Anxiety Depression  Neuromuscular disease (RLS)    GI/Hepatic negative GI ROS, Neg liver ROS,   Endo/Other  negative endocrine ROS  Renal/GU negative Renal ROS  negative genitourinary   Musculoskeletal  (+) Arthritis  (psoriasis),   Abdominal   Peds negative pediatric ROS (+)  Hematology negative hematology ROS (+)   Anesthesia Other Findings Back pain Autoimmune disorder  Reproductive/Obstetrics negative OB ROS                            Anesthesia Physical Anesthesia Plan  ASA: 2  Anesthesia Plan: General   Post-op Pain Management: Minimal or no pain anticipated   Induction: Intravenous  PONV Risk Score and Plan: Propofol infusion  Airway Management Planned: Nasal Cannula and Natural Airway  Additional Equipment:   Intra-op Plan:   Post-operative Plan:   Informed Consent: I have reviewed the patients History and Physical, chart, labs and discussed the procedure including the risks, benefits and alternatives for the proposed anesthesia with the patient or authorized representative who has indicated his/her understanding and acceptance.     Dental advisory given  Plan Discussed with: CRNA and Surgeon  Anesthesia Plan Comments:         Anesthesia Quick Evaluation

## 2021-12-01 NOTE — Transfer of Care (Signed)
Immediate Anesthesia Transfer of Care Note  Patient: Connie Jensen  Procedure(s) Performed: COLONOSCOPY WITH PROPOFOL BIOPSY  Patient Location: PACU  Anesthesia Type:MAC  Level of Consciousness: drowsy  Airway & Oxygen Therapy: Patient Spontanous Breathing and Patient connected to nasal cannula oxygen  Post-op Assessment: Report given to RN and Post -op Vital signs reviewed and stable  Post vital signs: Reviewed and stable  Last Vitals:  Vitals Value Taken Time  BP    Temp 36.4 C 12/01/21 1349  Pulse 87 12/01/21 1349  Resp    SpO2      Last Pain:  Vitals:   12/01/21 1349  TempSrc: Oral  PainSc:       Patients Stated Pain Goal: 7 (56/70/14 1030)  Complications: No notable events documented.

## 2021-12-01 NOTE — Discharge Instructions (Signed)
You are being discharged to home.  Resume your previous diet.  We are waiting for your pathology results.  Your physician has recommended a repeat colonoscopy in five years for screening purposes.  

## 2021-12-01 NOTE — Interval H&P Note (Signed)
History and Physical Interval Note:  12/01/2021 12:13 PM  Connie Jensen  has presented today for surgery, with the diagnosis of Diarrhea.  The various methods of treatment have been discussed with the patient and family. After consideration of risks, benefits and other options for treatment, the patient has consented to  Procedure(s) with comments: COLONOSCOPY WITH PROPOFOL (N/A) - 140 ASA 1 as a surgical intervention.  The patient's history has been reviewed, patient examined, no change in status, stable for surgery.  I have reviewed the patient's chart and labs.  Questions were answered to the patient's satisfaction.     [name]

## 2021-12-02 ENCOUNTER — Encounter (INDEPENDENT_AMBULATORY_CARE_PROVIDER_SITE_OTHER): Payer: Self-pay | Admitting: *Deleted

## 2021-12-03 ENCOUNTER — Other Ambulatory Visit (INDEPENDENT_AMBULATORY_CARE_PROVIDER_SITE_OTHER): Payer: Self-pay | Admitting: Gastroenterology

## 2021-12-03 DIAGNOSIS — K52832 Lymphocytic colitis: Secondary | ICD-10-CM

## 2021-12-03 LAB — SURGICAL PATHOLOGY

## 2021-12-03 MED ORDER — BUDESONIDE 3 MG PO CPEP: ORAL_CAPSULE | ORAL | 0 refills | Status: DC

## 2021-12-07 ENCOUNTER — Encounter (HOSPITAL_COMMUNITY): Payer: Self-pay | Admitting: Gastroenterology

## 2021-12-21 ENCOUNTER — Encounter (INDEPENDENT_AMBULATORY_CARE_PROVIDER_SITE_OTHER): Payer: Self-pay | Admitting: Gastroenterology

## 2021-12-28 ENCOUNTER — Ambulatory Visit: Payer: 59 | Admitting: Physician Assistant

## 2021-12-30 ENCOUNTER — Other Ambulatory Visit (INDEPENDENT_AMBULATORY_CARE_PROVIDER_SITE_OTHER): Payer: Self-pay | Admitting: Gastroenterology

## 2021-12-30 DIAGNOSIS — K52832 Lymphocytic colitis: Secondary | ICD-10-CM

## 2021-12-30 NOTE — Telephone Encounter (Signed)
Colonscopy done 12/03/21

## 2022-01-05 ENCOUNTER — Ambulatory Visit (INDEPENDENT_AMBULATORY_CARE_PROVIDER_SITE_OTHER): Payer: 59 | Admitting: Physician Assistant

## 2022-01-05 ENCOUNTER — Encounter: Payer: Self-pay | Admitting: Physician Assistant

## 2022-01-05 DIAGNOSIS — Z1283 Encounter for screening for malignant neoplasm of skin: Secondary | ICD-10-CM

## 2022-01-05 DIAGNOSIS — L409 Psoriasis, unspecified: Secondary | ICD-10-CM

## 2022-01-05 MED ORDER — ZORYVE 0.3 % EX CREA: TOPICAL_CREAM | CUTANEOUS | 11 refills | Status: DC

## 2022-01-05 MED ORDER — VTAMA 1 % EX CREA: TOPICAL_CREAM | CUTANEOUS | 11 refills | Status: DC

## 2022-01-05 MED ORDER — ENSTILAR 0.005-0.064 % EX FOAM: CUTANEOUS | 11 refills | Status: DC

## 2022-01-06 ENCOUNTER — Other Ambulatory Visit: Payer: Self-pay | Admitting: *Deleted

## 2022-01-06 ENCOUNTER — Telehealth: Payer: Self-pay | Admitting: *Deleted

## 2022-01-06 NOTE — Telephone Encounter (Signed)
Prior authorization done via covermymeds for patients Zoryve cream.

## 2022-01-20 ENCOUNTER — Encounter: Payer: Self-pay | Admitting: Physician Assistant

## 2022-01-20 NOTE — Progress Notes (Signed)
   Follow-Up Visit   Subjective  Connie Jensen is a 41 y.o. female who presents for the following: Annual Exam (Patient here today for yearly skin check, no concerns. No personal history or family history of atypical moles, melanoma or non mole skin cancer. ).   The following portions of the chart were reviewed this encounter and updated as appropriate:  Tobacco  Allergies  Meds  Problems  Med Hx  Surg Hx  Fam Hx      Objective  Well appearing patient in no apparent distress; mood and affect are within normal limits.  A full examination was performed including scalp, head, eyes, ears, nose, lips, neck, chest, axillae, abdomen, back, buttocks, bilateral upper extremities, bilateral lower extremities, hands, feet, fingers, toes, fingernails, and toenails. All findings within normal limits unless otherwise noted below.  Full body skin examination-No atypical nevi or signs of NMSC noted at the time of the visit.   Left Lower Leg - Anterior, Right Lower Leg - Anterior Well-marginated erythematous plaques. No evidence of inflamed joints or tenosynovitis.    Assessment & Plan  Encounter for screening for malignant neoplasm of skin  Yearly skin examination   Psoriasis Left Lower Leg - Anterior; Right Lower Leg - Anterior  Tapinarof (VTAMA) 1 % CREA - Left Lower Leg - Anterior, Right Lower Leg - Anterior Apply to affected area qd  Roflumilast (ZORYVE) 0.3 % CREA - Left Lower Leg - Anterior, Right Lower Leg - Anterior Apply to affected area qd  Calcipotriene-Betameth Diprop (ENSTILAR) 0.005-0.064 % FOAM - Left Lower Leg - Anterior, Right Lower Leg - Anterior Apply to affected area qd    I, Sidnie Swalley, PA-C, have reviewed all documentation's for this visit.  The documentation on 01/20/22 for the exam, diagnosis, procedures and orders are all accurate and complete.

## 2022-02-03 ENCOUNTER — Ambulatory Visit (INDEPENDENT_AMBULATORY_CARE_PROVIDER_SITE_OTHER): Payer: 59 | Admitting: Gastroenterology

## 2022-02-07 ENCOUNTER — Ambulatory Visit (INDEPENDENT_AMBULATORY_CARE_PROVIDER_SITE_OTHER): Payer: 59 | Admitting: Gastroenterology

## 2022-02-07 ENCOUNTER — Encounter (INDEPENDENT_AMBULATORY_CARE_PROVIDER_SITE_OTHER): Payer: Self-pay | Admitting: Gastroenterology

## 2022-02-07 VITALS — BP 108/75 | HR 120 | Ht 62.0 in | Wt 183.1 lb

## 2022-02-07 DIAGNOSIS — K52832 Lymphocytic colitis: Secondary | ICD-10-CM | POA: Diagnosis not present

## 2022-02-07 MED ORDER — BUDESONIDE 3 MG PO CPEP: ORAL_CAPSULE | ORAL | 0 refills | Status: AC

## 2022-02-07 NOTE — Progress Notes (Signed)
Referring Provider: No ref. provider found Primary Care Physician:  Pablo Lawrence, NP Primary GI Physician: Jenetta Downer   Chief Complaint  Patient presents with   Diarrhea    2 month follow up on diarrhea. Taking budesonide '3mg'$  tablets 3 daily currently. Requesting refill. Per patient taking second round and it has helped.    HPI:   Connie Jensen is a 41 y.o. female with past medical history of anxiety, polyarticular psoriatic arthritis.  Patient presenting today for follow up of lymphocytic colitis induced diarrhea.  Last seen 11/16/21 with diarrhea, 10-20 BMs per day, watery in nature, symptoms started in January she was treated with cipro, flagyl and xyfaxan, symptoms did not improve, spontaneously they stopped in march for about 2 weeks and she was back at baseline with 1 formed BM per day, she began with watery diarrhea again in April which has continued. Seen at urgent care in mid april with negative stool studies, cipro and bentyl did not provide any results. had been maintained on cosentyx x3 years for hx of polyarticular psoriatic arthritis and notes that in January she stopped responding to cosentyx, with current flare of her psoriasis, this was around the time she developed extensive diarrhea.   Patient was scheduled for colonoscopy for further evaluation. TCS as outlined below with findings of lymphocytic colitis, started on budesonide taper, '9mg'$  x30 days then '6mg'$  x60 days then '3mg'$  daily. Advised to stop duloxetine/diclofenac and other NSAIDs.   Present: Patient continued on '9mg'$  budesonide at this time, has 3 days left of this dose to complete 60 days. She reports that diarrhea has resolved, having 1-2 BMs per days that are formed. No rectal bleeding. Denies abdominal pain. Appetite is good, she has no weight loss. Denies any nausea or vomiting. She was stopped on duloxetine and started effexor after diagnosis of lymphocytic colitis.   She is on Skyrizi for her polyarticular  psoriatic arthritis with minimal joint pain and almost complete resolution of psoriatic plaques. She is feeling well on this.   Last Colonoscopy:12/01/21 The examined portion of the ileum was normal. Biopsied. - The entire examined colon is normal. Biopsied. 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[place])    Urticaria     Past Surgical History:  Procedure Laterality Date   BIOPSY  12/01/2021   Procedure: BIOPSY;  Surgeon: Harvel Quale, MD;  [facility];  Service: Gastroenterology;;   COLONOSCOPY WITH PROPOFOL N/A 12/01/2021   Procedure: COLONOSCOPY WITH PROPOFOL;  Surgeon: Harvel Quale, MD;  [facility];  Service: Gastroenterology;  Laterality: N/A;  140 ASA 1   CYST REMOVAL HAND Right    MASS EXCISION Right 01/26/2016   Procedure: RIGHT WRIST EXCISION GANGLION;  Surgeon: Leanora Cover, MD;  Location: [facility];  Service: Orthopedics;  Laterality: Right;   WISDOM TOOTH EXTRACTION      Current Outpatient Medications  Medication Sig Dispense Refill   alclomethasone (ACLOVATE) 0.05 % cream Apply to face qd-bid (Patient taking differently: Apply 1 application  topically daily as needed (psoriasis). Apply to face qd-bid Mix with ketoconazole) 60 g 3   budesonide (ENTOCORT EC) 3 MG 24 hr capsule Take 3 capsules (9 mg total) by mouth daily for  30 days, THEN 2 capsules (6 mg total) daily for 60 days, THEN 1 capsule (3 mg total) daily. 270 capsule 0   Calcipotriene-Betameth Diprop (ENSTILAR) 0.005-0.064 % FOAM Apply to affected area qd 60 g 11   diclofenac (VOLTAREN) 75 MG EC tablet Take 75 mg by mouth daily at 6 (six) AM.      Drospirenone (SLYND) 4 MG TABS Take 4 mg by mouth daily.     ketoconazole (NIZORAL) 2 % cream Apply to face qd-bid (Patient taking differently: Apply 1 application  topically daily as needed (Psorasis). Apply to face qd-bid Mix with Alclomethasone) 60 g 3   Roflumilast (ZORYVE) 0.3 % CREA Apply to affected area qd 60 g 11   SKYRIZI PEN 150 MG/ML SOAJ Inject into the skin.     Tapinarof (VTAMA) 1 % CREA Apply to affected area qd 60 g 11   traZODone (DESYREL) 150 MG tablet Take 150 mg by mouth at bedtime.     venlafaxine XR (EFFEXOR-XR) 37.5 MG 24 hr capsule Take 37.5 mg by mouth daily.     dicyclomine (BENTYL) 20 MG tablet Take 1 tablet (20 mg total) by mouth 3 (three) times daily before meals. Take as needed (Patient not taking: Reported on 02/07/2022) 90 tablet 0   dicyclomine (BENTYL) 20 MG tablet Take by mouth. (Patient not taking: Reported on 02/07/2022)     DULoxetine HCl 60 MG CSDR Take 60 mg by mouth daily. (Patient not taking: Reported on 02/07/2022)     No current facility-administered medications for this visit.    Allergies as of 02/07/2022 - Review Complete 02/07/2022  Allergen Reaction Noted   Humira [adalimumab] Rash 12/23/2020    No family history on file.  Social History   Socioeconomic History   Marital status: Single    Spouse name: Not on file   Number of children: Not on file   Years of education: Not on file   Highest education level: Not on file  Occupational History   Not on file  Tobacco Use   Smoking status: Every Day    Packs/day: 1.00    Years: 20.00    Total pack years: 20.00    Types: Cigarettes    Passive exposure: Current   Smokeless tobacco: Never  Vaping Use   Vaping Use: Never used  Substance and Sexual Activity   Alcohol use: No   Drug use: No   Sexual activity: Not on file  Other Topics Concern   Not on file  Social History Narrative   Not on file   Social Determinants of Health   Financial Resource Strain: Not on file  Food  Insecurity: Not on file  Transportation Needs: Not on file  Physical Activity: Not on file  Stress: Not on file  Social Connections: Not on file    Review of systems General: negative for malaise, night sweats, fever, chills, weight loss Neck: Negative for lumps, goiter, pain and significant neck swelling Resp: Negative for cough, wheezing, dyspnea at rest CV: Negative for chest pain, leg swelling, palpitations, orthopnea GI: denies melena, hematochezia, nausea, vomiting, diarrhea, constipation, dysphagia, odyonophagia, early satiety or unintentional weight loss.  MSK: Negative for joint pain or swelling, back pain, and muscle pain. Derm: Negative for itching or rash Psych: Denies depression, anxiety, memory loss, confusion. No homicidal or suicidal ideation.  Heme: Negative for prolonged bleeding, bruising easily, and swollen nodes. Endocrine: Negative for cold or heat intolerance, polyuria, polydipsia and goiter. Neuro: negative for tremor, gait imbalance, syncope  and seizures. The remainder of the review of systems is noncontributory.  Physical Exam: BP 108/75 (BP Location: Left Arm, Patient Position: Sitting, Cuff Size: Large)   Pulse (!) 120   Ht '5\' 2"'$  (1.575 m)   Wt 183 lb 1.6 oz (83.1 kg)   BMI 33.49 kg/m  General:   Alert and oriented. No distress noted. Pleasant and cooperative.  Head:  Normocephalic and atraumatic. Eyes:  Conjuctiva clear without scleral icterus. Mouth:  Oral mucosa pink and moist. Good dentition. No lesions. Heart: Normal rate and rhythm, s1 and s2 heart sounds present.  Lungs: Clear lung sounds in all lobes. Respirations equal and unlabored. Abdomen:  +BS, soft, non-tender and non-distended. No rebound or guarding. No HSM or masses noted. Derm: No palmar erythema or jaundice Msk:  Symmetrical without gross deformities. Normal posture. Extremities:  Without edema. Neurologic:  Alert and  oriented x4 Psych:  Alert and cooperative. Normal mood and  affect.  Invalid input(s): "6 MONTHS"   ASSESSMENT: Connie Jensen is a 41 y.o. female presenting today for follow up of diarrhea secondary to lymphocytic colitis.  Patient with profuse diarrhea, 10-20 episodes per day when last seen in June 2023, underwent TCS with biopsies positive for lymphocytic colitis, she was started on '9mg'$  budesonide x60 days which she has 3 days left of. She reports she is doing much better. Has no more diarrhea or abdominal pain. Having 1-2 formed BMs per day without rectal bleeding, melena, nausea or vomiting. Appetite is good, no weight loss. Will transition to '6mg'$ /day x60 days then '3mg'$ /day x60 days once '9mg'$  dosing is completed. She will make me aware of change in bowel habits with taper of steroids.   She was started on Skyrizi for her hx of polyarticular psoriatic arthritis and is having very good results from this. Joint pain is minimal and her psoriasis is almost cleared up.   PLAN:  finish 2 months worth of '9mg'$  daily  2. Start '6mg'$ /day x60 days then '3mg'$ /day x60 days  3. Pt to make me aware of recurrence of diarrhea   All questions were answered, patient verbalized understanding and is in agreement with plan as outlined above.    Follow Up: 3 months   Cassidie Veiga L. Alver Sorrow, MSN, APRN, AGNP-C Adult-Gerontology Nurse Practitioner [facility]

## 2022-02-07 NOTE — Patient Instructions (Signed)
I'm so glad you are feeling better! I have sent refill of the budesonide, once you have finished the 60 days of '9mg'$  dosing, you will take '6mg'$  x60 days then '3mg'$  x60 days. If you notice a change in symptoms with recurrence of diarrhea while tapering the dosage, please let me know  Follow up 3 months

## 2022-04-25 ENCOUNTER — Ambulatory Visit (INDEPENDENT_AMBULATORY_CARE_PROVIDER_SITE_OTHER): Payer: 59 | Admitting: Gastroenterology

## 2022-04-25 ENCOUNTER — Encounter (INDEPENDENT_AMBULATORY_CARE_PROVIDER_SITE_OTHER): Payer: Self-pay | Admitting: Gastroenterology

## 2022-04-25 VITALS — BP 111/70 | HR 97 | Temp 98.7°F | Ht 62.0 in | Wt 187.0 lb

## 2022-04-25 DIAGNOSIS — K52832 Lymphocytic colitis: Secondary | ICD-10-CM | POA: Diagnosis not present

## 2022-04-25 NOTE — Progress Notes (Addendum)
Referring Provider: Pablo Lawrence, NP Primary Care Physician:  Pablo Lawrence, NP Primary GI Physician: Jenetta Downer   Chief Complaint  Patient presents with   lymphocytic colitis    3 month follow up on lymphocytic colitis. Reports no concerns today.    HPI:   Connie Jensen is a 41 y.o. female with past medical history of anxiety, polyarticular psoriatic arthritis.   Patient presenting today for follow up of lymphocytic colitis.  History: seen 11/16/21 with diarrhea, 10-20 BMs per day, watery in nature, treated several times with abx therapy with some improvement then recurrence of symptoms. Scheduled for colonoscopy, as outlined below with findings of lymphocytic colitis. started on budesonide taper, '9mg'$  x30 days then '6mg'$  x60 days then '3mg'$  daily. Advised to stop duloxetine/diclofenac and other NSAIDs.    Last seen August 2023, at that time continued on '9mg'$  budesonide, has 3 days left of this dose to complete 60 days. She reports that diarrhea has resolved, having 1-2 BMs per days that are formed. No rectal bleeding. Denies abdominal pain. Appetite is good, she has no weight loss. Denies any nausea or vomiting. She was stopped on duloxetine and started effexor after diagnosis of lymphocytic colitis.    Advised to finish 2 months worth of '9mg'$  daily then Start '6mg'$ /day x60 days then '3mg'$ /day x60 days.  Present:   Patient is not currently taking budesonide, states that she went on vacation and missed taking her budesonide for a week. She had about 1 month left of the '3mg'$  daily dosing, however, as she was having no diarrhea, she did not resume the medication once returning home. she is having 1 formed BM per day. She denies any current GI issues. No red flag symptoms. Patient denies melena, hematochezia, nausea, vomiting, diarrhea, constipation, dysphagia, odyonophagia, early satiety or weight loss.    Last Colonoscopy:12/01/21 The examined portion of the ileum was normal. Biopsied. - The  entire examined colon is normal. Biopsied. 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[place])    Urticaria     Past Surgical History:  Procedure Laterality Date   BIOPSY  12/01/2021   Procedure: BIOPSY;  Surgeon: Harvel Quale, MD;  [facility];  Service: Gastroenterology;;   COLONOSCOPY WITH PROPOFOL N/A 12/01/2021   Procedure: COLONOSCOPY WITH PROPOFOL;  Surgeon: Harvel Quale, MD;  [facility];  Service: Gastroenterology;  Laterality: N/A;  140 ASA 1   CYST REMOVAL HAND Right    MASS EXCISION Right 01/26/2016   Procedure: RIGHT WRIST EXCISION GANGLION;  Surgeon: Leanora Cover, MD;  Location: [facility];  Service: Orthopedics;  Laterality: Right;   WISDOM TOOTH EXTRACTION      Current Outpatient Medications  Medication Sig Dispense Refill   alclomethasone (ACLOVATE) 0.05 % cream Apply to face qd-bid (Patient taking differently: Apply 1 application  topically daily as needed (psoriasis). Apply to face qd-bid Mix with ketoconazole) 60 g 3   Calcipotriene-Betameth Diprop (ENSTILAR) 0.005-0.064 % FOAM Apply to affected area qd 60 g 11   diclofenac (VOLTAREN) 75 MG EC tablet Take 75 mg by mouth daily at 6 (six) AM.     Roflumilast (ZORYVE) 0.3 % CREA Apply to affected area qd 60 g 11  SKYRIZI PEN 150 MG/ML SOAJ Inject into the skin.     Tapinarof (VTAMA) 1 % CREA Apply to affected area qd 60 g 11   venlafaxine XR (EFFEXOR-XR) 37.5 MG 24 hr capsule Take 37.5 mg by mouth daily.     budesonide (ENTOCORT EC) 3 MG 24 hr capsule Take 2 capsules (6 mg total) by mouth daily for 60 days, THEN 1 capsule (3 mg total) daily. (Patient  not taking: Reported on 04/25/2022) 180 capsule 0   Drospirenone (SLYND) 4 MG TABS Take 4 mg by mouth daily. (Patient not taking: Reported on 04/25/2022)     traZODone (DESYREL) 150 MG tablet Take 150 mg by mouth at bedtime. (Patient not taking: Reported on 04/25/2022)     No current facility-administered medications for this visit.    Allergies as of 04/25/2022 - Review Complete 04/25/2022  Allergen Reaction Noted   Humira [adalimumab] Rash 12/23/2020    No family history on file.  Social History   Socioeconomic History   Marital status: Single    Spouse name: Not on file   Number of children: Not on file   Years of education: Not on file   Highest education level: Not on file  Occupational History   Not on file  Tobacco Use   Smoking status: Every Day    Packs/day: 1.00    Years: 20.00    Total pack years: 20.00    Types: Cigarettes    Passive exposure: Current   Smokeless tobacco: Never  Vaping Use   Vaping Use: Never used  Substance and Sexual Activity   Alcohol use: No   Drug use: No   Sexual activity: Not on file  Other Topics Concern   Not on file  Social History Narrative   Not on file   Social Determinants of Health   Financial Resource Strain: Not on file  Food Insecurity: Not on file  Transportation Needs: Not on file  Physical Activity: Not on file  Stress: Not on file  Social Connections: Not on file   Review of systems General: negative for malaise, night sweats, fever, chills, weight losa Neck: Negative for lumps, goiter, pain and significant neck swelling Resp: Negative for cough, wheezing, dyspnea at rest CV: Negative for chest pain, leg swelling, palpitations, orthopnea GI: denies melena, hematochezia, nausea, vomiting, diarrhea, constipation, dysphagia, odyonophagia, early satiety or unintentional weight loss.  MSK: Negative for joint pain or swelling, back pain, and muscle pain. Derm: Negative for itching or rash Psych: Denies  depression, anxiety, memory loss, confusion. No homicidal or suicidal ideation.  Heme: Negative for prolonged bleeding, bruising easily, and swollen nodes. Endocrine: Negative for cold or heat intolerance, polyuria, polydipsia and goiter. Neuro: negative for tremor, gait imbalance, syncope and seizures. The remainder of the review of systems is noncontributory.  Physical Exam: BP 111/70 (BP Location: Left Arm, Patient Position: Sitting, Cuff Size: Large)   Pulse 97   Temp 98.7 F (37.1 C) (Oral)   Ht '5\' 2"'$  (1.575 m)   Wt 187 lb (84.8 kg)   BMI 34.20 kg/m  General:   Alert and oriented. No distress noted. Pleasant and cooperative.  Head:  Normocephalic and atraumatic. Eyes:  Conjuctiva clear without scleral icterus. Mouth:  Oral mucosa pink and moist. Good dentition. No lesions. Heart: Normal rate and rhythm, s1 and s2 heart sounds present.  Lungs: Clear lung sounds in all lobes. Respirations equal and unlabored. Abdomen:  +BS, soft, non-tender and non-distended. No rebound or guarding. No HSM or  masses noted. Derm: No palmar erythema or jaundice Msk:  Symmetrical without gross deformities. Normal posture. Extremities:  Without edema. Neurologic:  Alert and  oriented x4 Psych:  Alert and cooperative. Normal mood and affect.  Invalid input(s): "6 MONTHS"   ASSESSMENT: ERDINE HULEN is a 41 y.o. female presenting today for follow up of lymphocytic colitis.  Previously with perfuse diarrhea in June, colonoscopy revealed lymphocytic colitis. Patient was started on budesonide '9mg'$  daily x60 days, '6mg'$  daily x60 days then '3mg'$  daily x60 days. At last visit in August she was doing well and near completion of her '9mg'$  daiy dosing, with resolution of her diarrhea, having 1-2 BMs per day. Today she notes she was down to her '3mg'$  dosing with about 1 month left, she forgot meds for 1 week while on vacation and given symptoms were resolved, she decided not to resume upon returning home. She is  having 1 solid BM per day and has no GI complaints. As she appears to be in clinical remission, no further steroids warranted at this time. She will make me aware of any recurrence of diarrhea or other GI symptoms.    PLAN:  Continue to avoid NSAIDs  2. Pt to make me aware of new GI symptoms  3. Plan for repeat colonoscopy in 2028  All questions were answered, patient verbalized understanding and is in agreement with plan as outlined above.    Follow up: PRN  Connie Jensen L. Alver Sorrow, MSN, APRN, AGNP-C Adult-Gerontology Nurse Practitioner [facility]  I have reviewed the note and agree with the APP's assessment as described in this progress note  Maylon Peppers, MD Gastroenterology and Hepatology [facility]astro[facility]

## 2022-04-25 NOTE — Patient Instructions (Signed)
It was nice to see you, and I am glad you are doing well! Please let me know if you have any new or worsening GI symptoms. We will plan to see you on as needed basis Please continue to avoid NSAIDs (advil, aleve, naproxen, goody powder, ibuprofen).

## 2023-01-02 ENCOUNTER — Encounter (INDEPENDENT_AMBULATORY_CARE_PROVIDER_SITE_OTHER): Payer: Self-pay

## 2023-01-02 ENCOUNTER — Other Ambulatory Visit (INDEPENDENT_AMBULATORY_CARE_PROVIDER_SITE_OTHER): Payer: Self-pay | Admitting: Gastroenterology

## 2023-01-02 DIAGNOSIS — R197 Diarrhea, unspecified: Secondary | ICD-10-CM

## 2023-01-05 LAB — C DIFFICILE TOXINS A+B W/RFLX: C difficile Toxins A+B, EIA: NEGATIVE

## 2023-01-05 LAB — GI PROFILE, STOOL, PCR
Adenovirus F 40/41: NOT DETECTED
Astrovirus: NOT DETECTED
Campylobacter: NOT DETECTED
Cryptosporidium: NOT DETECTED
Cyclospora cayetanensis: NOT DETECTED
Enteroaggregative E coli: NOT DETECTED
Enterotoxigenic E coli: NOT DETECTED
Giardia lamblia: NOT DETECTED
Norovirus GI/GII: NOT DETECTED
Plesiomonas shigelloides: NOT DETECTED
Rotavirus A: NOT DETECTED
Salmonella: NOT DETECTED
Sapovirus: NOT DETECTED
Shiga-toxin-producing E coli: NOT DETECTED
Shigella/Enteroinvasive E coli: NOT DETECTED
Vibrio cholerae: NOT DETECTED
Vibrio: NOT DETECTED
Yersinia enterocolitica: NOT DETECTED

## 2023-01-05 LAB — C DIFFICILE, CYTOTOXIN B

## 2023-01-10 ENCOUNTER — Telehealth (INDEPENDENT_AMBULATORY_CARE_PROVIDER_SITE_OTHER): Payer: Self-pay | Admitting: *Deleted

## 2023-01-10 ENCOUNTER — Other Ambulatory Visit (INDEPENDENT_AMBULATORY_CARE_PROVIDER_SITE_OTHER): Payer: Self-pay | Admitting: Gastroenterology

## 2023-01-10 DIAGNOSIS — K52832 Lymphocytic colitis: Secondary | ICD-10-CM

## 2023-01-10 MED ORDER — BUDESONIDE 3 MG PO CPEP: ORAL_CAPSULE | ORAL | 0 refills | Status: DC

## 2023-01-10 NOTE — Telephone Encounter (Signed)
Discussed with patient per DR. Levon Hedger -  will send budesonide to pharmacy. We will do same taper but she will need to continue with 3 mg dosing indefinitely - she will need to follow up in clinic but will need to call us before she runs out of her prescription.  Pt verbalized understanding

## 2023-01-10 NOTE — Telephone Encounter (Signed)
Thanks, will send budesonide to pharmacy. We will do same taper but she will need to continue with 3 mg dosing indefinitely - she will need to follow up in clinic but will need to call us before she runs out of her prescription.

## 2023-01-10 NOTE — Telephone Encounter (Signed)
Called pt with lab results -- Cdiff and GI path negative. Further recommendations when Gruver returns . Patient states Leeroy Bock told her if stool test were negative she would send her in some meds. She is having about 20 epidsodes of diarrhea per day. No blood in stool. Has abdominal pain before the diarrhea.   [facility] [address] .

## 2023-01-23 ENCOUNTER — Encounter (INDEPENDENT_AMBULATORY_CARE_PROVIDER_SITE_OTHER): Payer: Self-pay | Admitting: Gastroenterology

## 2023-01-23 ENCOUNTER — Ambulatory Visit (INDEPENDENT_AMBULATORY_CARE_PROVIDER_SITE_OTHER): Payer: 59 | Admitting: Gastroenterology

## 2023-01-23 VITALS — BP 117/68 | HR 97 | Temp 98.7°F | Ht 62.0 in | Wt 187.8 lb

## 2023-01-23 DIAGNOSIS — K52832 Lymphocytic colitis: Secondary | ICD-10-CM | POA: Diagnosis not present

## 2023-01-23 NOTE — Progress Notes (Addendum)
Referring Provider: Roe Rutherford, NP Primary Care Physician:  Roe Rutherford, NP Primary GI Physician: Dr. Levon Hedger   Chief Complaint  Patient presents with   Diarrhea    Follow up on diarrhea. Has been on budesonide 9 mg for about one and a half weeks. Diarrhea has stopped. Had first solid stool last Saturday.    HPI:   Connie Jensen is a 42 y.o. female with past medical history of  anxiety, polyarticular psoriatic arthritis.    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 She underwent colonoscopy as outlined below, did budesonide taper and had resolution of her symptoms. Seen last in November, not currently on steroids and having 1 formed stool per day.   Patient messaged on 7/22 with recurrence of frequent liquid stools, C diff and GI pathogen panel negative, started on budesonide 9mg  taper on 7/30, advised she will need to continue with 3mg  daily dose indefinitely once tapered down that far.  Present:  Patient states she had been taking diclofenac for about 6 years and she had sudden onset of diarrhea at the end of July. She stopped diclofenac, she states that she immediately had improvement after first day of budesonide. She had started on effexor after her colonoscopy last year as previous anxiety/depression medication (duloxetine) was thought to have possibly contributed to [facility]. She had a solid stools usually twice per day now. She is still on effexor, her PCP sent her a low dose but she states she did not do well before when she had to change over her depression/anxiety meds. She is concerned about coming off of it. Denies any rectal bleeding or melena. No abdominal pain. Appetite is improving since being on budesonide. She is feeling much better since starting steroids    Last Colonoscopy:12/01/21 The examined portion of the ileum was normal. Biopsied. - The entire  examined colon is normal. Biopsied. - Non-bleeding internal hemorrhoids (Biopsies showed very mild inflammation in the terminal ileum but presence of intraepithelial lymphocytes throughout the colon biopsies consistent with lymphocytic colitis) Last Endoscopy:never   Recommendations:  Repeat TCS in 5 years   Past Medical History:  Diagnosis Date   Anxiety    Psoriasis with arthropathy (HCC)    Urticaria     Past Surgical History:  Procedure Laterality Date   BIOPSY  12/01/2021   Procedure: BIOPSY;  Surgeon: Dolores Frame, MD;  [facility];  Service: Gastroenterology;;   COLONOSCOPY WITH PROPOFOL N/A 12/01/2021   Procedure: COLONOSCOPY WITH PROPOFOL;  Surgeon: Dolores Frame, MD;  [facility];  Service: Gastroenterology;  Laterality: N/A;  140 ASA 1   CYST REMOVAL HAND Right    MASS EXCISION Right 01/26/2016   Procedure: RIGHT WRIST EXCISION GANGLION;  Surgeon: Betha Loa, MD;  Location: [facility];  Service: Orthopedics;  Laterality: Right;   WISDOM TOOTH EXTRACTION      Current Outpatient Medications  Medication Sig Dispense Refill   alclomethasone (ACLOVATE) 0.05 % cream Apply to face qd-bid (Patient taking differently: Apply 1 application  topically daily as needed (psoriasis). Apply to face qd-bid Mix with ketoconazole) 60 g 3   budesonide (ENTOCORT EC) 3 MG 24 hr capsule Take 3 capsules (9 mg total) by mouth daily for 60 days, THEN 2 capsules (6 mg total) daily for 60 days, THEN 1 capsule (3 mg total) daily. 360 capsule 0  Calcipotriene-Betameth Diprop (ENSTILAR) 0.005-0.064 % FOAM Apply to affected area qd 60 g 11   Drospirenone (SLYND) 4 MG TABS Take 4 mg by mouth daily.     Roflumilast (ZORYVE) 0.3 % CREA Apply to affected area qd 60 g 11   SKYRIZI PEN 150 MG/ML SOAJ Inject into the skin.     Tapinarof (VTAMA) 1 % CREA Apply to affected area qd 60 g 11   traZODone (DESYREL) 150 MG tablet Take 150 mg by mouth  at bedtime.     venlafaxine XR (EFFEXOR-XR) 37.5 MG 24 hr capsule Take 37.5 mg by mouth daily.     No current facility-administered medications for this visit.    Allergies as of 01/23/2023 - Review Complete 01/23/2023  Allergen Reaction Noted   Humira [adalimumab] Rash 12/23/2020    No family history on file.  Social History   Socioeconomic History   Marital status: Single    Spouse name: Not on file   Number of children: Not on file   Years of education: Not on file   Highest education level: Not on file  Occupational History   Not on file  Tobacco Use   Smoking status: Every Day    Current packs/day: 1.00    Average packs/day: 1 pack/day for 20.0 years (20.0 ttl pk-yrs)    Types: Cigarettes    Passive exposure: Current   Smokeless tobacco: Never  Vaping Use   Vaping status: Never Used  Substance and Sexual Activity   Alcohol use: No   Drug use: No   Sexual activity: Not on file  Other Topics Concern   Not on file  Social History Narrative   Not on file   Social Determinants of Health   Financial Resource Strain: Medium Risk (02/16/2021)   Received from [facility], [facility]   Overall Financial Resource Strain (CARDIA)    Difficulty of Paying Living Expenses: Somewhat hard  Food Insecurity: No Food Insecurity (12/31/2021)   Received from [facility], [facility]   Hunger Vital Sign    Worried About Running Out of Food in the Last Year: Never true    Ran Out of Food in the Last Year: Never true  Transportation Needs: No Transportation Needs (02/16/2021)   Received from [facility], [facility]   PRAPARE - Transportation    Lack of Transportation (Medical): No    Lack of Transportation (Non-Medical): No  Physical Activity: Sufficiently Active (02/16/2021)   Received from [facility], [facility]   Exercise Vital Sign    Days of Exercise per Week: 5 days    Minutes of Exercise per Session: 30 min  Stress: Unknown (02/16/2021)   Received from  [facility] - Occupational Stress Questionnaire    Feeling of Stress : Patient declined  Social Connections: Unknown (10/26/2021)   Received from [facility], [facility]   Social Network    Social Network: Not on file   Review of systems General: negative for malaise, night sweats, fever, chills, weight loss Neck: Negative for lumps, goiter, pain and significant neck swelling Resp: Negative for cough, wheezing, dyspnea at rest CV: Negative for chest pain, leg swelling, palpitations, orthopnea GI: denies melena, hematochezia, nausea, vomiting, diarrhea, constipation, dysphagia, odyonophagia, early satiety or unintentional weight loss.  MSK: Negative for joint pain or swelling, back pain, and muscle pain. Derm: Negative for itching or rash Psych: Denies depression, anxiety, memory loss, confusion. No homicidal or suicidal ideation.  Heme: Negative for prolonged bleeding, bruising easily, and swollen nodes. Endocrine: Negative for cold or heat intolerance, polyuria, polydipsia and goiter. Neuro: negative for tremor, gait imbalance, syncope and seizures. The remainder of the review of systems is noncontributory.  Physical Exam: There were no vitals taken for this visit. General:   Alert and oriented. No distress noted. Pleasant and cooperative.  Head:  Normocephalic and atraumatic. Eyes:  Conjuctiva clear without scleral icterus. Mouth:  Oral mucosa pink and moist. Good dentition. No lesions. Heart: Normal rate and rhythm, s1 and s2 heart sounds present.  Lungs: Clear lung sounds in all lobes. Respirations equal and unlabored. Abdomen:  +BS, soft, non-tender and non-distended. No rebound or guarding. No HSM or masses noted. Derm: No palmar erythema or jaundice Msk:  Symmetrical without gross deformities. Normal posture. Extremities:  Without edema. Neurologic:  Alert and  oriented x4 Psych:  Alert and cooperative. Normal  mood and affect.  Invalid input(s): "6 MONTHS"   ASSESSMENT: Connie Jensen is a 42 y.o. female presenting today for diarrhea secondary to lymphocytic colitis  Patient with lymphocytic colitis initially diagnosed in June 2023, she completed budesonide taper with resolution of her symptoms.  Notably she had been maintained on diclofenac daily for the past 6 or so years.  In July 2024 she began having 10-20 episodes of watery diarrhea per day.  Stool testing was negative for infectious etiology.  She was started on budesonide taper and is now having 1- 2 solid stools per day.  She was previously advised to stop her diclofenac which she has done.  She was taken off of duloxetine and started on Effexor after her colonoscopy last year as it was thought this may have contributed to her LC.  She reports PCP did send her a lower dose of Effexor however she is hesitant to try and wean off of this at this time as she feels she had a lot of side effects last time she weaned off her anxiety/depression medication.  As she has had improvement in her symptoms, we will continue with budesonide taper and I advised her we can continue with her Effexor at current dose for now, however she has recurrence of diarrhea with tapering of her budesonide dose may need to consider change in anxiety/depression therapy.  She should continue to avoid NSAIDs and may be helpful to avoid dairy, high fat foods, sugar, caffeine in her diet as well.   PLAN:  Continue with budesonide taper  2. Continue to avoid NSAIDs  3. Consider change in effexor if diarrhea recurs despite steroids  4. Avoid high fat, caffeine, sugar, dairy in diet  All questions were answered, patient verbalized understanding and is in agreement with plan as outlined above.    Follow Up: 3 months   Maekayla Giorgio L. Jeanmarie Hubert, MSN, APRN, AGNP-C Adult-Gerontology Nurse Practitioner [facility]  I have reviewed the note and agree with the APP's  assessment as described in this progress note  Katrinka Blazing, MD Gastroenterology and Hepatology [facility]

## 2023-01-23 NOTE — Patient Instructions (Addendum)
Please continue with budesonide taper as you are doing We will need to keep you on 3mg  daily dosing indefinitely once you have completed the taper, please make me aware once you are getting to the end of your current prescription   Please continue to avoid NSAIDs (diclofenac, ibuprofen, aleve, naproxen, goody/BC powder) It would be a good idea to try and limit, dairy, caffeine, sugar and high fat foods from your diet  Follow up 3 months   It was a pleasure to see you today. I want to create trusting relationships with patients and provide genuine, compassionate, and quality care. I truly value your feedback! please be on the lookout for a survey regarding your visit with me today. I appreciate your input about our visit and your time in completing this!    Min Tunnell L. Jeanmarie Hubert, MSN, APRN, AGNP-C Adult-Gerontology Nurse Practitioner [facility] Gastroenterology at [facility]

## 2023-03-16 ENCOUNTER — Encounter (INDEPENDENT_AMBULATORY_CARE_PROVIDER_SITE_OTHER): Payer: Self-pay

## 2023-03-30 ENCOUNTER — Encounter (INDEPENDENT_AMBULATORY_CARE_PROVIDER_SITE_OTHER): Payer: Self-pay

## 2023-03-30 ENCOUNTER — Telehealth (INDEPENDENT_AMBULATORY_CARE_PROVIDER_SITE_OTHER): Payer: Self-pay | Admitting: *Deleted

## 2023-03-30 NOTE — Telephone Encounter (Signed)
I received a message this morning from Connie Jensen with [facility] stating they still had not received the records they needed to process patients claim for GI lab. I called the number she provided and spoke to [place] V and she told me the records had been received and they were in review. She gave me the document control number of [id number]. Erin told me if patient or I get another call that records haven't been received we are to give them the document control number. I sent Ms Kitch a MyChart message letting her know

## 2023-04-25 ENCOUNTER — Encounter (INDEPENDENT_AMBULATORY_CARE_PROVIDER_SITE_OTHER): Payer: Self-pay | Admitting: Gastroenterology

## 2023-04-25 ENCOUNTER — Ambulatory Visit (INDEPENDENT_AMBULATORY_CARE_PROVIDER_SITE_OTHER): Payer: 59 | Admitting: Gastroenterology

## 2023-04-25 VITALS — BP 119/83 | HR 101 | Temp 98.0°F | Ht 61.0 in | Wt 191.7 lb

## 2023-04-25 DIAGNOSIS — K52832 Lymphocytic colitis: Secondary | ICD-10-CM | POA: Diagnosis not present

## 2023-04-25 NOTE — Progress Notes (Addendum)
Referring Provider: Roe Rutherford, NP Primary Care Physician:  Roe Rutherford, NP Primary GI Physician: Dr. Levon Hedger   Chief Complaint  Patient presents with   Follow-up    Patient here today for a follow up visit. Patient denies any current issues.   HPI:   Connie Jensen is a 42 y.o. female with past medical history of anxiety, polyarticular psoriatic arthritis.   Patient presenting today for follow-up of lymphocytic colitis  Patient last seen August 2024, at that time reported sudden onset of diarrhea at the end of July.  She stopped diclofenac at that time restarted on budesonide (7/30) and had immediate improvement in her symptoms.  Having a solid stool twice daily.  Still taking Effexor.  Recommended to continue budesonide taper, avoid NSAIDs, avoid high fat, caffeine, sugar, dairy in diet, consider changing to Effexor if diarrhea recurs despite steroids.  Present: Patient states she is doing some better but still having diarrhea. Having 2-3 stools per day. She notes that stools continue to be watery. She has recently stepped down to 6mg  per day 4-5 days ago. She notes some soreness in her upper abdomen since she dropped down to 6mg  budesonide. Denies any other new medications or medication changes.  She notes that she recently had a fall and went to urgent care on 8/30 where she received an IM injection of an NSAID (toradol?). She thinks things may have gotten worse after this. She is not taking any other NSAIDs. Has been on effexor since after colonoscopy last year when she was advised to switch from duloxetine. No rectal bleeding, melena, weight loss, changes in appetite, nausea or vomiting.    Last Colonoscopy:12/01/21 The examined portion of the ileum was normal. Biopsied. - The entire examined colon is normal. Biopsied. - Non-bleeding internal hemorrhoids (Biopsies showed very mild inflammation in the terminal ileum but presence of intraepithelial lymphocytes  throughout the colon biopsies consistent with lymphocytic colitis) Last Endoscopy:never   Recommendations:  Repeat TCS in 5 years   Past Medical History:  Diagnosis Date   Anxiety    Psoriasis with arthropathy (HCC)    Urticaria     Past Surgical History:  Procedure Laterality Date   BIOPSY  12/01/2021   Procedure: BIOPSY;  Surgeon: Dolores Frame, MD;  [facility];  Service: Gastroenterology;;   COLONOSCOPY WITH PROPOFOL N/A 12/01/2021   Procedure: COLONOSCOPY WITH PROPOFOL;  Surgeon: Dolores Frame, MD;  [facility];  Service: Gastroenterology;  Laterality: N/A;  140 ASA 1   CYST REMOVAL HAND Right    MASS EXCISION Right 01/26/2016   Procedure: RIGHT WRIST EXCISION GANGLION;  Surgeon: Betha Loa, MD;  Location: [facility];  Service: Orthopedics;  Laterality: Right;   WISDOM TOOTH EXTRACTION      Current Outpatient Medications  Medication Sig Dispense Refill   budesonide (ENTOCORT EC) 3 MG 24 hr capsule Take 3 capsules (9 mg total) by mouth daily for 60 days, THEN 2 capsules (6 mg total) daily for 60 days, THEN 1 capsule (3 mg total) daily. 360 capsule 0   Drospirenone (SLYND) 4 MG TABS Take 4 mg by mouth daily.     SKYRIZI PEN 150 MG/ML SOAJ Inject into the skin.     traZODone (DESYREL) 150 MG tablet Take 150 mg by mouth at bedtime.     venlafaxine XR (EFFEXOR-XR) 37.5 MG 24 hr capsule Take 37.5 mg by mouth daily.     No current facility-administered medications for this visit.  Allergies as of 04/25/2023 - Review Complete 04/25/2023  Allergen Reaction Noted   Humira [adalimumab] Rash 12/23/2020    History reviewed. No pertinent family history.  Social History   Socioeconomic History   Marital status: Single    Spouse name: Not on file   Number of children: Not on file   Years of education: Not on file   Highest education level: Not on file  Occupational History   Not on file  Tobacco Use   Smoking  status: Every Day    Current packs/day: 1.00    Average packs/day: 1 pack/day for 20.0 years (20.0 ttl pk-yrs)    Types: Cigarettes    Passive exposure: Current   Smokeless tobacco: Never  Vaping Use   Vaping status: Never Used  Substance and Sexual Activity   Alcohol use: No   Drug use: No   Sexual activity: Not on file  Other Topics Concern   Not on file  Social History Narrative   Not on file   Social Determinants of Health   Financial Resource Strain: Medium Risk (02/16/2021)   Received from [facility], [facility]   Overall Financial Resource Strain (CARDIA)    Difficulty of Paying Living Expenses: Somewhat hard  Food Insecurity: No Food Insecurity (12/31/2021)   Received from [facility], [facility]   Hunger Vital Sign    Worried About Running Out of Food in the Last Year: Never true    Ran Out of Food in the Last Year: Never true  Transportation Needs: No Transportation Needs (02/16/2021)   Received from [facility], [facility]   PRAPARE - Transportation    Lack of Transportation (Medical): No    Lack of Transportation (Non-Medical): No  Physical Activity: Sufficiently Active (02/16/2021)   Received from [facility], [facility]   Exercise Vital Sign    Days of Exercise per Week: 5 days    Minutes of Exercise per Session: 30 min  Stress: Unknown (02/16/2021)   Received from [facility] - Occupational Stress Questionnaire    Feeling of Stress : Patient declined  Social Connections: Unknown (10/26/2021)   Received from [facility], [facility]   Social Network    Social Network: Not on file    Review of systems General: negative for malaise, night sweats, fever, chills, weight los Neck: Negative for lumps, goiter, pain and significant neck swelling Resp: Negative for cough, wheezing, dyspnea at rest CV: Negative for chest pain, leg swelling, palpitations, orthopnea GI: denies melena,  hematochezia, nausea, vomiting,  constipation, dysphagia, odyonophagia, early satiety or unintentional weight loss. +diarrhea +upper abdominal pain  MSK: Negative for joint pain or swelling, back pain, and muscle pain. Derm: Negative for itching or rash Psych: Denies depression, anxiety, memory loss, confusion. No homicidal or suicidal ideation.  Heme: Negative for prolonged bleeding, bruising easily, and swollen nodes. Endocrine: Negative for cold or heat intolerance, polyuria, polydipsia and goiter. Neuro: negative for tremor, gait imbalance, syncope and seizures. The remainder of the review of systems is noncontributory.  Physical Exam: BP 119/83 (BP Location: Left Arm, Patient Position: Sitting, Cuff Size: Large)   Pulse (!) 101   Temp 98 F (36.7 C) (Temporal)   Ht 5\' 1"  (1.549 m)   Wt 191 lb 11.2 oz (87 kg)   BMI 36.22 kg/m  General:   Alert and oriented. No distress noted. Pleasant and cooperative.  Head:  Normocephalic and atraumatic. Eyes:  Conjuctiva clear without  scleral icterus. Mouth:  Oral mucosa pink and moist. Good dentition. No lesions. Heart: Normal rate and rhythm, s1 and s2 heart sounds present.  Lungs: Clear lung sounds in all lobes. Respirations equal and unlabored. Abdomen:  +BS, soft, and non-distended. Mild TTP of upper abdomen. No rebound or guarding. No HSM or masses noted. Derm: No palmar erythema or jaundice Msk:  Symmetrical without gross deformities. Normal posture. Extremities:  Without edema. Neurologic:  Alert and  oriented x4 Psych:  Alert and cooperative. Normal mood and affect.  Invalid input(s): "6 MONTHS"   ASSESSMENT: SHANTEY TOCK is a 42 y.o. female presenting today for follow up of lymphocytic colitis  Patient with history of lymphocytic colitis first diagnosed mid 2023 with successful treatment via budesonide taper, however she had recurrence of suspected lymphocytic colitis in July 2024 after use of diclofenac which she has since  stopped.  She was previously on duloxetine but switched to Effexor after her colonoscopy in 2023.  She was started this course of budesonide taper in July and reports decrease in number of stools, however she is still having 2-3 watery bowel movements per day, recently stepped down to 6mg  daily 4-5 days ago but notes same amount of stooling even when she was on the 9 mg daily dose.  She is now endorsing some upper abdominal pain. She did receive an injection of what sounds to be Toradol at the end of August and thinks symptoms may have worsened again after this.  At this time would recommend stepping back up to 9 mg of budesonide for the next 6 to 8 weeks to see how she does as I am unsure if injection of Toradol may have exacerbated her symptoms again.  Will reassess in 6 weeks, if symptoms are not improving may need to consider changing Effexor to a different drug therapy, however, considering this is not an SSRI, less likely to be exacerbating her symptoms.  She should continue to avoid all NSAIDs.   PLAN:  Resume budesonide 9mg  daily x6-8 more weeks  2. Try stepping down to 6mg  budesonide thereafter  3. May need to consider change in effexor if diarrhea not improving on 9mg  budesonide, and/or addition of cholestyramine 4. Strict avoidance of ALL NSAIDs  All questions were answered, patient verbalized understanding and is in agreement with plan as outlined above.    Follow Up: 6 weeks   Estell Puccini L. Jeanmarie Hubert, MSN, APRN, AGNP-C Adult-Gerontology Nurse Practitioner [facility]  I have reviewed the note and agree with the APP's assessment as described in this progress note  Katrinka Blazing, MD Gastroenterology and Hepatology [facility] Gastroenterology

## 2023-04-25 NOTE — Patient Instructions (Signed)
Please increase budesonide back to 9mg , we will plan to do this for about 60 days but I will bring you back for follow up in about 6 weeks to see how you are doing.   If you are still having diarrhea, we may need to consider changing effexor to something else  Please continue to avoid NSAIDs (advil, aleve, naproxen, goody/BC powder, ibuprofen, diclofenac) as these can be very hard on your GI tract, causing inflammation, ulcers and damage to the lining of your GI tract.   Follow up 6 weeks  It was a pleasure to see you today. I want to create trusting relationships with patients and provide genuine, compassionate, and quality care. I truly value your feedback! please be on the lookout for a survey regarding your visit with me today. I appreciate your input about our visit and your time in completing this!    Hearl Heikes L. Jeanmarie Hubert, MSN, APRN, AGNP-C Adult-Gerontology Nurse Practitioner [facility] Gastroenterology at [facility]

## 2023-05-24 ENCOUNTER — Other Ambulatory Visit (INDEPENDENT_AMBULATORY_CARE_PROVIDER_SITE_OTHER): Payer: Self-pay | Admitting: Gastroenterology

## 2023-05-24 DIAGNOSIS — K52832 Lymphocytic colitis: Secondary | ICD-10-CM

## 2023-05-24 MED ORDER — BUDESONIDE 3 MG PO CPEP: ORAL_CAPSULE | ORAL | 0 refills | Status: DC

## 2023-06-05 ENCOUNTER — Ambulatory Visit (INDEPENDENT_AMBULATORY_CARE_PROVIDER_SITE_OTHER): Payer: 59 | Admitting: Gastroenterology

## 2023-06-05 ENCOUNTER — Encounter (INDEPENDENT_AMBULATORY_CARE_PROVIDER_SITE_OTHER): Payer: Self-pay | Admitting: Gastroenterology

## 2023-06-05 VITALS — BP 128/82 | HR 102 | Temp 97.8°F | Ht 61.0 in | Wt 192.6 lb

## 2023-06-05 DIAGNOSIS — K52832 Lymphocytic colitis: Secondary | ICD-10-CM

## 2023-06-05 MED ORDER — CHOLESTYRAMINE 4 G PO PACK: 4.0000 g | PACK | Freq: Every day | ORAL | 2 refills | Status: DC

## 2023-06-05 NOTE — Patient Instructions (Addendum)
Please continue with the budesonide taper as you are doing You can stop prilosec if you do not feel any improvement in this I am going to add cholestyramine 4g for you to take daily  Continue to avoid NSAIDs   We will see you back in February to see how symptoms are doing at that time

## 2023-06-05 NOTE — Progress Notes (Signed)
Referring Provider: Roe Rutherford, NP Primary Care Physician:  Roe Rutherford, NP Primary GI Physician: Dr. Levon Hedger   Chief Complaint  Patient presents with   lymphocytic colitis    Follow up on lymphocytic colitis. Having about 2 loose stools per day.    HPI:   Connie Jensen is a 42 y.o. female with past medical history of anxiety, polyarticular psoriatic arthritis.    Patient presenting today for follow-up of lymphocytic colitis   Patient last seen 04/25/2023, at that time she had been started on budesonide taper and advised to avoid NSAIDs for suspected recurrence of lymphocytic colitis starting in August.  At last office visit she noted having 2-3 stools per day that were still watery.  She had recently stepdown to 6 mg of budesonide about 4 to 5 days prior to.  She did have recent IM injection of what sounded to be Toradol which she thinks may have worsened her symptoms.  Patient was recommended to resume budesonide 9 mg daily x 6-8 more weeks, try stepping down to 6 mg budesonide thereafter, may consider changing to Effexor if diarrhea not improving on 9 mg of budesonide and/or add cholestyramine and should avoid all NSAIDs.  Present: Patient states that she has not abdominal pain since being back on 9mg  budesonide. Still having looser stools, occasional watery stool on budesonide 9mg  daily. No solid stools in a few months. No rectal bleeding or melena. Symptoms mildly improved on higher dose of budesonide though during her initial onset of lymphocytic colitis last year she had formed solid stools on budesonide 9 mg daily.  PCP started her on prilosec for epigastric pain which she does not feel has made much difference. She notes more gas on this. She states that her RA is flared up since she has been avoiding NSAIDs due to her LC.   Last Colonoscopy:12/01/21 The examined portion of the ileum was normal. Biopsied. - The entire examined colon is normal. Biopsied. -  Non-bleeding internal hemorrhoids (Biopsies showed very mild inflammation in the terminal ileum but presence of intraepithelial lymphocytes throughout the colon biopsies consistent with lymphocytic colitis) Last Endoscopy:never   Recommendations:  Repeat TCS in 5 years     Past Medical History:  Diagnosis Date   Anxiety    Psoriasis with arthropathy (HCC)    Urticaria     Past Surgical History:  Procedure Laterality Date   BIOPSY  12/01/2021   Procedure: BIOPSY;  Surgeon: Dolores Frame, MD;  [facility];  Service: Gastroenterology;;   COLONOSCOPY WITH PROPOFOL N/A 12/01/2021   Procedure: COLONOSCOPY WITH PROPOFOL;  Surgeon: Dolores Frame, MD;  [facility];  Service: Gastroenterology;  Laterality: N/A;  140 ASA 1   CYST REMOVAL HAND Right    MASS EXCISION Right 01/26/2016   Procedure: RIGHT WRIST EXCISION GANGLION;  Surgeon: Betha Loa, MD;  Location:  SUR[facility]ERY [facility];  Service: Orthopedics;  Laterality: Right;   WISDOM TOOTH EXTRACTION      Current Outpatient Medications  Medication Sig Dispense Refill   budesonide (ENTOCORT EC) 3 MG 24 hr capsule Take 9mg  for the next 30 days then 6mg  for 60 days then 3mg  for 60 days 270 capsule 0   Drospirenone (SLYND) 4 MG TABS Take 4 mg by mouth daily.     omeprazole (PRILOSEC) 40 MG capsule Take 40 mg by mouth daily.     SKYRIZI PEN 150 MG/ML SOAJ Inject into the skin.     traZODone (DESYREL) 150  MG tablet Take 150 mg by mouth at bedtime.     venlafaxine XR (EFFEXOR-XR) 37.5 MG 24 hr capsule Take 37.5 mg by mouth daily.     No current facility-administered medications for this visit.    Allergies as of 06/05/2023 - Review Complete 06/05/2023  Allergen Reaction Noted   Humira [adalimumab] Rash 12/23/2020    No family history on file.  Social History   Socioeconomic History   Marital status: Single    Spouse name: Not on file   Number of children: Not on file   Years of  education: Not on file   Highest education level: Not on file  Occupational History   Not on file  Tobacco Use   Smoking status: Every Day    Current packs/day: 1.00    Average packs/day: 1 pack/day for 20.0 years (20.0 ttl pk-yrs)    Types: Cigarettes    Passive exposure: Current   Smokeless tobacco: Never  Vaping Use   Vaping status: Never Used  Substance and Sexual Activity   Alcohol use: No   Drug use: No   Sexual activity: Not on file  Other Topics Concern   Not on file  Social History Narrative   Not on file   Social Drivers of Health   Financial Resource Strain: Low Risk  (05/02/2023)   Received [facility]   Overall Financial Resource Strain (CARDIA)    Difficulty of Paying Living Expenses: Not very hard  Food Insecurity: No Food Insecurity (05/02/2023)   Received from [facility]   Hunger Vital Sign    Worried About Running Out of Food in the Last Year: Never true    Ran Out of Food in the Last Year: Never true  Transportation Needs: No Transportation Needs (05/02/2023)   Received from [facility] - Transportation    Lack of Transportation (Medical): No    Lack of Transportation (Non-Medical): No  Physical Activity: Sufficiently Active (02/16/2021)   Received from [facility], [facility]   Exercise Vital Sign    Days of Exercise per Week: 5 days    Minutes of Exercise per Session: 30 min  Stress: Unknown (02/16/2021)   Received from [facility] - Occupational Stress Questionnaire    Feeling of Stress : Patient declined  Social Connections: Unknown (10/26/2021)   Received from [facility], [facility]   Social Network    Social Network: Not on file    Review of systems General: negative for malaise, night sweats, fever, chills, weight loss Neck: Negative for lumps, goiter, pain and significant neck swelling Resp: Negative for cough, wheezing, dyspnea at rest CV:  Negative for chest pain, leg swelling, palpitations, orthopnea GI: denies melena, hematochezia, nausea, vomiting, constipation, dysphagia, odyonophagia, early satiety or unintentional weight loss. +loose stools  MSK: Negative for joint pain or swelling, back pain, and muscle pain. Derm: Negative for itching or rash Psych: Denies depression, anxiety, memory loss, confusion. No homicidal or suicidal ideation.  Heme: Negative for prolonged bleeding, bruising easily, and swollen nodes. Endocrine: Negative for cold or heat intolerance, polyuria, polydipsia and goiter. Neuro: negative for tremor, gait imbalance, syncope and seizures. The remainder of the review of systems is noncontributory.  Physical Exam: BP 128/82 (BP Location: Right Arm, Patient Position: Sitting, Cuff Size: Normal)   Pulse (!) 102   Temp 97.8 F (36.6 C) (Oral)   Ht 5\' 1"  (1.549 m)   Wt 192 lb  9.6 oz (87.4 kg)   BMI 36.39 kg/m  General:   Alert and oriented. No distress noted. Pleasant and cooperative.  Head:  Normocephalic and atraumatic. Eyes:  Conjuctiva clear without scleral icterus. Mouth:  Oral mucosa pink and moist. Good dentition. No lesions. Heart: Normal rate and rhythm, s1 and s2 heart sounds present.  Lungs: Clear lung sounds in all lobes. Respirations equal and unlabored. Abdomen:  +BS, soft, non-tender and non-distended. No rebound or guarding. No HSM or masses noted. Derm: No palmar erythema or jaundice Msk:  Symmetrical without gross deformities. Normal posture. Extremities:  Without edema. Neurologic:  Alert and  oriented x4 Psych:  Alert and cooperative. Normal mood and affect.  Invalid input(s): "6 MONTHS"   ASSESSMENT: Connie Jensen is a 42 y.o. female presenting today for follow-up of lymphocytic colitis.  Patient with initial onset of diarrhea in June 2023 with upwards of 10-20 BMs per day.  She underwent colonoscopy which showed lymphocytic colitis and completed budesonide taper with  resolution of her symptoms.  Patient began having diarrhea again in July 2024, infectious etiology was ruled out she was then again started on budesonide taper on 01/10/2023.  At last visit in November she had recently stepdown to 6 mg of budesonide a day and was having 2-3 watery stools per day.  Notably had not had as a good of a response on budesonide 9 mg daily as she did with initial lymphocytic colitis flare last year.  Patient did stop her diclofenac in July after diarrhea began as it was thought that NSAIDs could have induced her LC again.  She has had some improvement with stepping back up to 9 mg of budesonide daily though still having at least 2 looser stools per day, sometimes watery stools.  At this time will add low-dose cholestyramine 4 g daily along with her budesonide which she should continue to taper as per instructions provided.  She should continue to avoid all NSAIDs.  Will see her back in mid-to-late February when she has stepdown to 6 mg of budesonide to determine if we can continue further with taper or if she should remain at a higher dosage. As prilosec has not provided any improvement for her, I advised her she may stop this.    PLAN:  Continue budesonide taper  2.  Cholestyramine 4g daily  3.  Continue to avoid all nsaids  4. Can stop prilosec   All questions were answered, patient verbalized understanding and is in agreement with plan as outlined above.   Follow Up: Mid to late February   Kunio Cummiskey L. Jeanmarie Hubert, MSN, APRN, AGNP-C Adult-Gerontology Nurse Practitioner [facility]

## 2023-08-03 ENCOUNTER — Ambulatory Visit (INDEPENDENT_AMBULATORY_CARE_PROVIDER_SITE_OTHER): Payer: 59 | Admitting: Gastroenterology

## 2023-09-04 ENCOUNTER — Other Ambulatory Visit (INDEPENDENT_AMBULATORY_CARE_PROVIDER_SITE_OTHER): Payer: Self-pay | Admitting: Gastroenterology

## 2023-09-12 ENCOUNTER — Encounter (INDEPENDENT_AMBULATORY_CARE_PROVIDER_SITE_OTHER): Payer: Self-pay | Admitting: Gastroenterology

## 2023-09-12 ENCOUNTER — Ambulatory Visit (INDEPENDENT_AMBULATORY_CARE_PROVIDER_SITE_OTHER): Payer: 59 | Admitting: Gastroenterology

## 2023-09-12 VITALS — BP 119/80 | HR 90 | Temp 98.9°F | Ht 62.0 in | Wt 196.4 lb

## 2023-09-12 DIAGNOSIS — K52832 Lymphocytic colitis: Secondary | ICD-10-CM

## 2023-09-12 NOTE — Patient Instructions (Signed)
 As symptoms are improved with sulfasalzine, we will continue with budesonide taper to completion. If you find that diarrhea is recurring as you taper down, please let me know. If rheumatology is going to keep you on sulfasalazine, this may continue to control your lymphocytic colitis as well.  Continue to avoid all NSAIDs  Follow up 3 months  It was a pleasure to see you today. I want to create trusting relationships with patients and provide genuine, compassionate, and quality care. I truly value your feedback! please be on the lookout for a survey regarding your visit with me today. I appreciate your input about our visit and your time in completing this!    Herchel Hopkin L. Jeanmarie Hubert, MSN, APRN, AGNP-C Adult-Gerontology Nurse Practitioner [facility]

## 2023-09-12 NOTE — Progress Notes (Signed)
 Referring Provider: Roe Rutherford, NP Primary Care Physician:  Roe Rutherford, NP Primary GI Physician: Dr. Levon Hedger    Chief Complaint  Patient presents with   Follow-up    Pt arrives for follow up. No issues/concerns at this time.     HPI:   Connie Jensen is a 43 y.o. female with past medical history of  anxiety, polyarticular psoriatic arthritis, lymphocytic colitis   Patient presenting today for:  Follow up of lymphocytic colitis  Last seen December 2024, at that time abdominal pain since being back on 9 mg of budesonide.  Still having looser stools, occasionally watery stools and budesonide 9 mg daily.  PCP started her on Prilosec for epigastric pain which she does not feel is made much difference.  Notes more gas on this.  States her polyarticular psoriatic arthritis is flared up since she has been avoiding NSAIDs due to her colitis.  Patient recommended to continue budesonide taper, start cholestyramine 4 g daily, continue to avoid all NSAIDs, stop Prilosec  Present:  States she could not drink the cholestyramine. She is taking budesonide and goes down to 3mg  tomorrow. Having 1-2 BMs per day, stools are more formed but still not completely solid. She was started on sulfasalazine 500mg  BID about 3 weeks ago by her rheumatologist in addition to her Cristy Folks to help control her polyarticular psoriatic arthritis, which she feels has helped her lymphocytic colitis as well.  She has no belly pain, rectal bleeding, melena.  Overall feels that her bowel habits have improved significantly.  She did not note any worsening looser stools when she stepped down from 1 mg to 6 mg of budesonide since starting sulfasalazine.   Last Colonoscopy:12/01/21 The examined portion of the ileum was normal. Biopsied. - The entire examined colon is normal. Biopsied. - Non-bleeding internal hemorrhoids (Biopsies showed very mild inflammation in the terminal ileum but presence of intraepithelial  lymphocytes throughout the colon biopsies consistent with lymphocytic colitis) Last Endoscopy:never   Recommendations:  Repeat TCS in 5 years  @There  were no vitals filed for this visit.   Past Medical History:  Diagnosis Date   Anxiety    Psoriasis with arthropathy (HCC)    Urticaria     Past Surgical History:  Procedure Laterality Date   BIOPSY  12/01/2021   Procedure: BIOPSY;  Surgeon: Dolores Frame, MD;  [facility];  Service: Gastroenterology;;   COLONOSCOPY WITH PROPOFOL N/A 12/01/2021   Procedure: COLONOSCOPY WITH PROPOFOL;  Surgeon: Dolores Frame, MD;  [facility];  Service: Gastroenterology;  Laterality: N/A;  140 ASA 1   CYST REMOVAL HAND Right    MASS EXCISION Right 01/26/2016   Procedure: RIGHT WRIST EXCISION GANGLION;  Surgeon: Betha Loa, MD;  Location: [facility];  Service: Orthopedics;  Laterality: Right;   WISDOM TOOTH EXTRACTION      Current Outpatient Medications  Medication Sig Dispense Refill   budesonide (ENTOCORT EC) 3 MG 24 hr capsule Take 9mg  for the next 30 days then 6mg  for 60 days then 3mg  for 60 days 270 capsule 0   Drospirenone (SLYND) 4 MG TABS Take 4 mg by mouth daily.     SKYRIZI PEN 150 MG/ML SOAJ Inject into the skin.     sulfaSALAzine (AZULFIDINE) 500 MG tablet Take 500 mg by mouth 2 (two) times daily.     traZODone (DESYREL) 150 MG tablet Take 150 mg by mouth at bedtime.     venlafaxine XR (EFFEXOR-XR) 37.5 MG  24 hr capsule Take 37.5 mg by mouth daily.     No current facility-administered medications for this visit.    Allergies as of 09/12/2023 - Review Complete 09/12/2023  Allergen Reaction Noted   Humira [adalimumab] Rash 12/23/2020    Social History   Socioeconomic History   Marital status: Single    Spouse name: Not on file   Number of children: Not on file   Years of education: Not on file   Highest education level: Not on file  Occupational History   Not on  file  Tobacco Use   Smoking status: Every Day    Current packs/day: 1.00    Average packs/day: 1 pack/day for 20.0 years (20.0 ttl pk-yrs)    Types: Cigarettes    Passive exposure: Current   Smokeless tobacco: Never  Vaping Use   Vaping status: Never Used  Substance and Sexual Activity   Alcohol use: No   Drug use: No   Sexual activity: Not on file  Other Topics Concern   Not on file  Social History Narrative   Not on file   Social Drivers of Health   Financial Resource Strain: Low Risk  (08/22/2023)   Received from [facility]   Overall Financial Resource Strain (CARDIA)    Difficulty of Paying Living Expenses: Not hard at all  Food Insecurity: No Food Insecurity (08/22/2023)   Received from [facility]   Hunger Vital Sign    Worried About Running Out of Food in the Last Year: Never true    Ran Out of Food in the Last Year: Never true  Transportation Needs: No Transportation Needs (08/22/2023)   Received from [facility] - Transportation    Lack of Transportation (Medical): No    Lack of Transportation (Non-Medical): No  Physical Activity: Sufficiently Active (02/16/2021)   Received [facility]   Exercise Vital Sign    Days of Exercise per Week: 5 days    Minutes of Exercise per Session: 30 min  Stress: Unknown (02/16/2021)   Received from [facility] - Occupational Stress Questionnaire    Feeling of Stress : Patient declined  Social Connections: Unknown (10/26/2021)   Received from [facility]   Social Network    Social Network: Not on file    Review of systems General: negative for malaise, night sweats, fever, chills, weight loss Neck: Negative for lumps, goiter, pain and significant neck swelling Resp: Negative for cough, wheezing, dyspnea at rest CV: Negative for chest pain, leg swelling, palpitations, orthopnea GI: denies melena, hematochezia,  nausea, vomiting, diarrhea, constipation, dysphagia, odyonophagia, early satiety or unintentional weight loss.  The remainder of the review of systems is noncontributory.  Physical Exam: There were no vitals taken for this visit. General:   Alert and oriented. No distress noted. Pleasant and cooperative.  Head:  Normocephalic and atraumatic. Eyes:  Conjuctiva clear without scleral icterus. Mouth:  Oral mucosa pink and moist. Good dentition. No lesions. Heart: Normal rate and rhythm, s1 and s2 heart sounds present.  Lungs: Clear lung sounds in all lobes. Respirations equal and unlabored. Abdomen:  +BS, soft, non-tender and non-distended. No rebound or guarding. No HSM or masses noted. Neurologic:  Alert and  oriented x4 Psych:  Alert and cooperative. Normal mood and affect.  Invalid input(s): "6 MONTHS"   ASSESSMENT: Connie Jensen is a 43 y.o. female presenting today for follow up of lymphocytic  colitis  Patient with initial onset diarrhea June 2023, upwards of 10-20 bowel movements per day.  She underwent colonoscopy which showed lymphocytic colitis and completed budesonide taper with resolution of her symptoms.  She then had recurrence of diarrhea again in July 2024, infectious etiology was ruled out and she was restarted on budesonide taper on 01/10/2023.  She had recurrence of diarrhea in November as she tapered down to 6 mg of budesonide, did have improvement in symptoms after she went back up to 9mg  daily.  At last visit she continued to have some looser stools therefore cholestyramine 4 g daily was added though she notes she could not tolerate cholestyramine.  She is currently on 6 mg of budesonide and will start 3 mg tomorrow.  Notably she was recently started on sulfasalazine 500 mg twice daily by her rheumatologist due to uncontrolled joint pain from her arthritis which she feels has made a difference in her stooling, now having 1-2 soft but formed stools per day, no abdominal pain.   She is avoiding NSAIDs.  At this time given she is on sulfasalazine which is likely contributing to her bowel improvements and helping to control her LC, would recommend she continue to taper budesonide to completion.  If she should have recurrence of diarrhea as she tapers, she will let me know.  She is seeing rheumatology in the next few weeks, I recommend she let me know if they plan to continue sulfasalazine as she likely may need to remain on something long-term to control her LC given refractory symptoms after stopping treatment last time.    PLAN:  Continue budesonide taper to completion 2. Pt to make me aware of diarrhea recurrence with steroid taper 3. Pt to let me know if rheumatology plans to continue on sulfasalazine  4. Continue to avoid NSAIDs.  All questions were answered, patient verbalized understanding and is in agreement with plan as outlined above.   Follow Up: 3 months   Tamber Burtch L. Jeanmarie Hubert, MSN, APRN, AGNP-C Adult-Gerontology Nurse Practitioner [facility]

## 2023-10-18 ENCOUNTER — Other Ambulatory Visit (INDEPENDENT_AMBULATORY_CARE_PROVIDER_SITE_OTHER): Payer: Self-pay | Admitting: Gastroenterology

## 2023-10-18 DIAGNOSIS — K52832 Lymphocytic colitis: Secondary | ICD-10-CM

## 2023-10-18 NOTE — Telephone Encounter (Signed)
 I called and left a message on the patient vm asked if she would return call to see if she is still taking this, if so what dose she is she currently taking.

## 2023-10-19 NOTE — Telephone Encounter (Signed)
 Tried calling call would not go through.

## 2023-10-20 ENCOUNTER — Encounter (INDEPENDENT_AMBULATORY_CARE_PROVIDER_SITE_OTHER): Payer: Self-pay

## 2023-10-23 ENCOUNTER — Encounter (INDEPENDENT_AMBULATORY_CARE_PROVIDER_SITE_OTHER): Payer: Self-pay

## 2023-12-12 ENCOUNTER — Ambulatory Visit (INDEPENDENT_AMBULATORY_CARE_PROVIDER_SITE_OTHER): Admitting: Gastroenterology

## 2024-01-04 ENCOUNTER — Encounter (INDEPENDENT_AMBULATORY_CARE_PROVIDER_SITE_OTHER): Payer: Self-pay | Admitting: Gastroenterology

## 2024-01-04 ENCOUNTER — Ambulatory Visit (INDEPENDENT_AMBULATORY_CARE_PROVIDER_SITE_OTHER): Admitting: Gastroenterology

## 2024-01-04 VITALS — BP 126/76 | HR 93 | Temp 98.4°F | Ht 62.0 in | Wt 197.0 lb

## 2024-01-04 DIAGNOSIS — K52832 Lymphocytic colitis: Secondary | ICD-10-CM | POA: Diagnosis not present

## 2024-01-04 MED ORDER — BUDESONIDE 3 MG PO CPEP: 6.0000 mg | ORAL_CAPSULE | Freq: Every day | ORAL | 1 refills | Status: AC

## 2024-01-04 NOTE — Progress Notes (Signed)
 Referring Provider: Suanne Pfeiffer, NP Primary Care Physician:  Suanne Pfeiffer, NP Primary GI Physician: Dr. Eartha   Chief Complaint  Patient presents with   Follow-up    Pt arrives for follow up. No issues/concerns at this time.   HPI:   Connie Jensen is a 43 y.o. female with past medical history of anxiety, polyarticular psoriatic arthritis, lymphocytic colitis   Patient presenting today for:  Lymphocytic colitis   Last seen April, at that time, had stopped cholestyramine  as she could not tolerate drinking this. Taking budesonide , about to go down to 3mg , having 1-2 BMs per day, stools formed but not completely solid. Started on sulfasalazine 500mg  BID 3 weeks prior in addition to her Nelma which she felt helped her stooling.   Recommended continue budesonide  to taper completion, make me aware of recurrence of diarrhea, avoid NSAIDs, let me know if Rheumatology had plans to continue/discontinue sulfasalazine  Present:  Currently still on budesonide  3mg  daily, on a good day having 2 looser stools per day, about 3 times per week will have upwards of 4-5 BMs per day that usually transition to watery. No solid stools. on 6mg  budesonide  she had 1-2 mostly formed but soft stools per day. No abdominal pain. She is still on sulfasalzine 500mg  BID. she notes at times  of higher stress she has more diarrhea. She cannot pinpoint any foods that seem to make her symptoms worse. No rectal bleeding or melena.  No NSAID use    Last Colonoscopy:12/01/21 The examined portion of the ileum was normal. Biopsied. - The entire examined colon is normal. Biopsied. - Non-bleeding internal hemorrhoids (Biopsies showed very mild inflammation in the terminal ileum but presence of intraepithelial lymphocytes throughout the colon biopsies consistent with lymphocytic colitis) Last Endoscopy:never   Recommendations:  Repeat TCS in 5 years   Filed Weights   01/04/24 1511  Weight: 197 lb (89.4 kg)      Past Medical History:  Diagnosis Date   Anxiety    Psoriasis with arthropathy (HCC)    Urticaria     Past Surgical History:  Procedure Laterality Date   BIOPSY  12/01/2021   Procedure: BIOPSY;  Surgeon: Eartha Angelia Sieving, MD;  [facility]: [facility];  Service: Gastroenterology;;   COLONOSCOPY WITH PROPOFOL  N/A 12/01/2021   Procedure: COLONOSCOPY WITH PROPOFOL ;  Surgeon: Eartha Angelia Sieving, MD;  Location: [facility];  Service: Gastroenterology;  Laterality: N/A;  140 ASA 1   CYST REMOVAL HAND Right    MASS EXCISION Right 01/26/2016   Procedure: RIGHT WRIST EXCISION GANGLION;  Surgeon: Franky Curia, MD;  Location: [facility];  Service: Orthopedics;  Laterality: Right;   WISDOM TOOTH EXTRACTION      Current Outpatient Medications  Medication Sig Dispense Refill   budesonide  (ENTOCORT EC ) 3 MG 24 hr capsule Take 1 capsule (3 mg total) by mouth daily. TAKE 9 MG(3 TABLETS) EVERY DAY FOR THE NEXT 30 DAYS THEN 6 MG(2 TABLETS) EVERY DAY FOR 60 DAYS THEN 3 MG(1 TABLET) EVERY DAY FOR 60 DAYS 90 capsule 1   Drospirenone (SLYND) 4 MG TABS Take 4 mg by mouth daily.     SKYRIZI PEN 150 MG/ML SOAJ Inject into the skin.     sulfaSALAzine (AZULFIDINE) 500 MG tablet Take 500 mg by mouth 2 (two) times daily.     traZODone (DESYREL) 150 MG tablet Take 150 mg by mouth at bedtime.     venlafaxine XR (EFFEXOR-XR) 37.5 MG 24 hr capsule Take 37.5  mg by mouth daily.     No current facility-administered medications for this visit.    Allergies as of 01/04/2024 - Review Complete 01/04/2024  Allergen Reaction Noted   Humira [adalimumab] Rash 12/23/2020    Social History   Socioeconomic History   Marital status: Single    Spouse name: Not on file   Number of children: Not on file   Years of education: Not on file   Highest education level: Not on file  Occupational History   Not on file  Tobacco Use   Smoking status: Every Day    Current packs/day: 1.00     Average packs/day: 1 pack/day for 20.0 years (20.0 ttl pk-yrs)    Types: Cigarettes    Passive exposure: Current   Smokeless tobacco: Never  Vaping Use   Vaping status: Never Used  Substance and Sexual Activity   Alcohol use: No   Drug use: No   Sexual activity: Not on file  Other Topics Concern   Not on file  Social History Narrative   Not on file   Social Drivers of Health   Financial Resource Strain: Low Risk  (08/22/2023)   Received from [facility]   Overall Financial Resource Strain (CARDIA)    Difficulty of Paying Living Expenses: Not hard at all  Food Insecurity: No Food Insecurity (08/22/2023)   Received from [facility]   Hunger Vital Sign    Within the past 12 months, you worried that your food would run out before you got the money to buy more.: Never true    Within the past 12 months, the food you bought just didn't last and you didn't have money to get more.: Never true  Transportation Needs: No Transportation Needs (08/22/2023)   Received from [facility] - Transportation    Lack of Transportation (Medical): No    Lack of Transportation (Non-Medical): No  Physical Activity: Sufficiently Active (02/16/2021)   Received from [facility]   Exercise Vital Sign    On average, how many days per week do you engage in moderate to strenuous exercise (like a brisk walk)?: 5 days    On average, how many minutes do you engage in exercise at this level?: 30 min  Stress: Unknown (02/16/2021)   Received from [facility] - Occupational Stress Questionnaire    Feeling of Stress : Patient declined  Social Connections: Unknown (10/26/2021)   Received from [facility]   Social Network    Social Network: Not on file    Review of systems General: negative for malaise, night sweats, fever, chills, weight loss Neck: Negative for lumps, goiter, pain and significant neck swelling Resp: Negative for cough, wheezing, dyspnea  at rest CV: Negative for chest pain, leg swelling, palpitations, orthopnea GI: denies melena, hematochezia, nausea, vomiting, constipation, dysphagia, odyonophagia, early satiety or unintentional weight loss. +looser stools  MSK: Negative for joint pain or swelling, back pain, and muscle pain. Derm: Negative for itching or rash Psych: Denies depression, anxiety, memory loss, confusion. No homicidal or suicidal ideation.  Heme: Negative for prolonged bleeding, bruising easily, and swollen nodes. Endocrine: Negative for cold or heat intolerance, polyuria, polydipsia and goiter. Neuro: negative for tremor, gait imbalance, syncope and seizures. The remainder of the review of systems is noncontributory.  Physical Exam: BP 126/76   Pulse 93   Temp 98.4 F (36.9 C)   Ht 5' 2 (1.575 m)   Wt 197 lb (89.4  kg)   BMI 36.03 kg/m  General:   Alert and oriented. No distress noted. Pleasant and cooperative.  Head:  Normocephalic and atraumatic. Eyes:  Conjuctiva clear without scleral icterus. Mouth:  Oral mucosa pink and moist. Good dentition. No lesions. Heart: Normal rate and rhythm, s1 and s2 heart sounds present.  Lungs: Clear lung sounds in all lobes. Respirations equal and unlabored. Abdomen:  +BS, soft, non-tender and non-distended. No rebound or guarding. No HSM or masses noted. Derm: No palmar erythema or jaundice Msk:  Symmetrical without gross deformities. Normal posture. Extremities:  Without edema. Neurologic:  Alert and  oriented x4 Psych:  Alert and cooperative. Normal mood and affect.  Invalid input(s): 6 MONTHS   ASSESSMENT: JUSTEEN HEHR is a 43 y.o. female presenting today for follow up of lymphocytic colitis  initial onset diarrhea June 2023, upwards of 10-20 bowel movements per day.  She underwent colonoscopy which showed lymphocytic colitis and completed budesonide  taper with resolution of her symptoms.   had recurrence of diarrhea again in July 2024, infectious  etiology was ruled out and she was restarted on budesonide  taper on 01/10/2023.  She had recurrence of diarrhea in November as she tapered down to 6 mg of budesonide , did have improvement in symptoms after she went back up to 9mg  daily.    Started on cholestyramine  4 g daily in December due to ongoing looser stools though she did not tolerate this.  She was having 1-2 looser to softer stools at last visit but transitioned down to 3 mg the day after her visit and notes having upwards of 4-5 stools, looser that transition to watery atleast 3 times per week, other days she has around 2 looser stools. No solid stools. Denies abdominal pain, rectal bleeding or melena. Stress tends to make her symptoms worse but cannot pinpoint any specific food triggers. Notably she was started on sulfasalazine 500 mg twice daily by her rheumatologist due to uncontrolled joint pain from her arthritis which initially seemed to help her stooling but as she tapered down to 3mg  stooling has increased.  There could be some underlying IBS, though I think this is less likely given she has no abdominal pain. Her symptoms are likely stemming from her lymphcytic colitis. For no we will go back up to 6mg  for the next 3 months, at follow up we can try again to taper down to 3mg  if she is doing better and have the option to potentially ad bismuth to see if dual therapy can help us  maintain her symptoms on the lowest steroid dose possible.   PLAN:  -increase budesonide  back to 6mg  daily -consider addition of bismuth at follow up if looser stools persists or to see if dual therapy will help better manage symptoms on lower steroid dosage -continue to avoid NSAIDs  All questions were answered, patient verbalized understanding and is in agreement with plan as outlined above.   Follow Up: 3 months   Kaushal Vannice L. Mariette, MSN, APRN, AGNP-C Adult-Gerontology Nurse Practitioner [facility]  I have reviewed the note and  agree with the APP's assessment as described in this progress note  Toribio Fortune, MD Gastroenterology and Hepatology [facility] Gastroenterology

## 2024-01-04 NOTE — Patient Instructions (Signed)
 Please increase budesonide  back to 6mg  per day If looser stools/diarrhea does not improve please let me know We may need to add bismuth to your regimen at next visit if you are still having looser stools or to see if we can wean you back down to 3mg  daily  Follow up 3 months  It was a pleasure to see you today. I want to create trusting relationships with patients and provide genuine, compassionate, and quality care. I truly value your feedback! please be on the lookout for a survey regarding your visit with me today. I appreciate your input about our visit and your time in completing this!    Connie Jensen L. Laityn Bensen, MSN, APRN, AGNP-C Adult-Gerontology Nurse Practitioner [facility] Gastroenterology at [facility]

## 2024-03-19 NOTE — Progress Notes (Signed)
 Subjective   Patient ID:  Connie Jensen is a 43 y.o. (DOB 1981-02-12) female    Patient presents with  . Follow-up     HPI  Connie Jensen is here today for follow-up and medication refills.  She does report overall doing well.  She continues to take budesonide  for ulcerative colitis.  Had to go back up to twice a day is followed by GI.  She is also on Skyrizi for psoriasis.  She does need her trazodone refilled, she recently had blood work with her rheumatologist we will try to get those results.  She needs a flu shot.   Reviewed and updated this visit by provider: None        Review of Systems  Constitutional: Negative.   Respiratory: Negative.    Cardiovascular: Negative.   Neurological: Negative.   Psychiatric/Behavioral: Negative.       Objective   Vitals:   03/19/24 0737  BP: 124/70  Patient Position: Sitting  Pulse: 91  Temp: 98 F (36.7 C)  TempSrc: Temporal  Resp: 16  Height: 5' 2 (1.575 m)  Weight: 186 lb (84.4 kg)  SpO2: 97%  BMI (Calculated): 34  PainSc: 0-No pain     Physical Exam Constitutional:      Appearance: Normal appearance.  HENT:     Head: Normocephalic.  Cardiovascular:     Rate and Rhythm: Normal rate and regular rhythm.     Pulses: Normal pulses.     Heart sounds: Normal heart sounds.  Pulmonary:     Effort: Pulmonary effort is normal.     Breath sounds: Normal breath sounds.  Neurological:     General: No focal deficit present.     Mental Status: She is alert and oriented to person, place, and time.  Psychiatric:        Mood and Affect: Mood normal.        Behavior: Behavior normal.        Thought Content: Thought content normal.        Judgment: Judgment normal.       Assessment and Plan  1. Psoriasis with arthropathy (*) (Primary) 2. Need for vaccination -     Flucelvax Trivalent Preservative Free Prefilled Syringe 3. Primary insomnia -     traZODone (DESYREL) 150 MG tablet; Take one tablet (150 mg dose) by mouth at  bedtime., Starting Tue 03/19/2024, Normal 4. Lymphocytic colitis     Follow up in about 4 months (around 07/20/2024) for physical, annual wellness visit.     Care plan and follow-up as discussed or as needed if any worsening symptoms or change in condition. Pt expressed understanding. No barriers to meeting goals. After visit summary was given to the patient.   Patient's Medications  New Prescriptions   No medications on file  Previous Medications   BUDESONIDE  (ENTOCORT EC ) 3 MG 24 HR CAPSULE    Take by mouth.   CRISABOROLE (EUCRISA) 2 % OINT OINTMENT    Apply one Application topically 2 (two) times daily.   DROSPIRENONE (SLYND) 4 MG TABS    Take one tablet (4 mg dose) by mouth daily at 6 (six) am.   METHOCARBAMOL (ROBAXIN-750) 750 MG TABLET    Take one tablet (750 mg dose) by mouth 4 (four) times a day as needed.   OMEPRAZOLE (PRILOSEC) 40 MG CAPSULE    TAKE 1 CAPSULE(40 MG) BY MOUTH DAILY   SKYRIZI PEN SOAJ INJECTION       SULFASALAZINE (AZULFIDINE) 500 MG TABLET  Take one tablet (500 mg dose) by mouth 2 (two) times daily.   VENLAFAXINE HCL (EFFEXOR-XR) 37.5 MG 24 HR CAPSULE    TAKE 1 CAPSULE(37.5 MG) BY MOUTH AT BEDTIME   VITAMIN D3, CHOLECALCIFEROL, (OPTIMAL-D) 50,000 UNITS CAPS    Take one capsule by mouth once a week.  Modified Medications   Modified Medication Previous Medication   TRAZODONE (DESYREL) 150 MG TABLET traZODone (DESYREL) 150 MG tablet      Take one tablet (150 mg dose) by mouth at bedtime.    TAKE 1 TABLET(150 MG) BY MOUTH AT BEDTIME  Discontinued Medications   BUDESONIDE  ER (UCERIS ) 9 MG 24 HR CAPSULE    Take one capsule (9 mg dose) by mouth daily.   RISANKIZUMAB-RZAA (SKYRIZI) SOSY INJECTION    Inject 1 mL (150 mg dose) into the skin once.   VENLAFAXINE HCL (EFFEXOR) 37.5 MG TABLET    Take one tablet (37.5 mg dose) by mouth 2 (two) times daily.      Risks, benefits, and alternatives of the medications and treatment plan prescribed today were discussed, and  patient expressed understanding. Plan follow-up as discussed or as needed if any worsening symptoms or change in condition.   A yearly preventative health exam was recommended and current age based recommendations were discussed.  I have reviewed the information contained in this note and personally verified its accuracy.  MDM billing - I personally developed the plan of care based on documented medical decision making. Charmaine Heller, NP

## 2024-03-25 ENCOUNTER — Ambulatory Visit (INDEPENDENT_AMBULATORY_CARE_PROVIDER_SITE_OTHER): Admitting: Gastroenterology

## 2024-03-26 ENCOUNTER — Encounter (INDEPENDENT_AMBULATORY_CARE_PROVIDER_SITE_OTHER): Payer: Self-pay | Admitting: Gastroenterology

## 2024-03-26 ENCOUNTER — Ambulatory Visit (INDEPENDENT_AMBULATORY_CARE_PROVIDER_SITE_OTHER): Admitting: Gastroenterology

## 2024-03-26 VITALS — BP 129/80 | HR 94 | Temp 97.1°F | Ht 62.0 in | Wt 199.2 lb

## 2024-03-26 DIAGNOSIS — K52832 Lymphocytic colitis: Secondary | ICD-10-CM | POA: Diagnosis not present

## 2024-03-26 MED ORDER — BISMUTH 262 MG PO CHEW: 786.0000 mg | CHEWABLE_TABLET | Freq: Three times a day (TID) | ORAL | 1 refills | Status: AC

## 2024-03-26 NOTE — Progress Notes (Addendum)
 Referring Provider: Suanne Pfeiffer, NP Primary Care Physician:  Suanne Pfeiffer, NP Primary GI Physician: Dr. Eartha   Chief Complaint  Patient presents with   Follow-up    Pt arrives for follow up. No issues/concerns at this time.    HPI:   Connie Jensen is a 43 y.o. female with past medical history of anxiety, polyarticular psoriatic arthritis, lymphocytic colitis   Patient presenting today for:  Follow up of lymphocytic colitis  Last seen July, at that time on budesonide  3mg  daily, having 2 looser stools per day, about 3 times per week having 4-5 stools per day, loose to watery. No solid stools, did better on 6mg  of budesonide  daily. No NSAID use.  Recommend increasing budesonide  to 6mg  daily, consider addition of bismuth at follow up if looser stools persist, continue to avoid NSAIDs  Labs on 8/27 with CMP, CBC unremarkable Acute Hep panel with negative Hep A/B, HCV Ab non reactive   Present:  States she feels stools improved slightly with increase to 6mg  budesonide  daily though she has about 3 looser stools, 5-7 on bristol stool scale. No abdominal pain. Will have flares of more watery stools a few times per month, usually at times of higher stress. No NSAID use   She did try cholestyramine  in the past and could not tolerate drinking this.  No red flag symptoms. Patient denies melena, hematochezia, nausea, vomiting, constipation, dysphagia, odyonophagia, early satiety or weight loss.    Last Colonoscopy:12/01/21 The examined portion of the ileum was normal. Biopsied. - The entire examined colon is normal. Biopsied. - Non-bleeding internal hemorrhoids (Biopsies showed very mild inflammation in the terminal ileum but presence of intraepithelial lymphocytes throughout the colon biopsies consistent with lymphocytic colitis) Last Endoscopy:never    Recommendations:  Repeat TCS in 5 years   Filed Weights   03/26/24 0846  Weight: 199 lb 3.2 oz (90.4 kg)      Past Medical History:  Diagnosis Date   Anxiety    Psoriasis with arthropathy (HCC)    Urticaria     Past Surgical History:  Procedure Laterality Date   BIOPSY  12/01/2021   Procedure: BIOPSY;  Surgeon: Eartha Angelia Sieving, MD;  [facility];  Service: Gastroenterology;;   COLONOSCOPY WITH PROPOFOL  N/A 12/01/2021   Procedure: COLONOSCOPY WITH PROPOFOL ;  Surgeon: Eartha Angelia Sieving, MD;  Location: [facility];  Service: Gastroenterology;  Laterality: N/A;  140 ASA 1   CYST REMOVAL HAND Right    MASS EXCISION Right 01/26/2016   Procedure: RIGHT WRIST EXCISION GANGLION;  Surgeon: Franky Curia, MD;  Location: [facility];  Service: Orthopedics;  Laterality: Right;   WISDOM TOOTH EXTRACTION      Current Outpatient Medications  Medication Sig Dispense Refill   budesonide  (ENTOCORT EC ) 3 MG 24 hr capsule Take 2 capsules (6 mg total) by mouth daily. 180 capsule 1   Drospirenone (SLYND) 4 MG TABS Take 4 mg by mouth daily.     SKYRIZI PEN 150 MG/ML SOAJ Inject into the skin.     sulfaSALAzine (AZULFIDINE) 500 MG tablet Take 500 mg by mouth 2 (two) times daily.     traZODone (DESYREL) 150 MG tablet Take 150 mg by mouth at bedtime.     venlafaxine XR (EFFEXOR-XR) 37.5 MG 24 hr capsule Take 37.5 mg by mouth daily.     No current facility-administered medications for this visit.    Allergies as of 03/26/2024 - Review Complete 03/26/2024  Allergen Reaction Noted  Humira [adalimumab] Rash 12/23/2020    Social History   Socioeconomic History   Marital status: Single    Spouse name: Not on file   Number of children: Not on file   Years of education: Not on file   Highest education level: Not on file  Occupational History   Not on file  Tobacco Use   Smoking status: Every Day    Current packs/day: 1.00    Average packs/day: 1 pack/day for 20.0 years (20.0 ttl pk-yrs)    Types: Cigarettes    Passive exposure: Current   Smokeless tobacco: Never   Vaping Use   Vaping status: Never Used  Substance and Sexual Activity   Alcohol use: No   Drug use: No   Sexual activity: Not on file  Other Topics Concern   Not on file  Social History Narrative   Not on file   Social Drivers of Health   Financial Resource Strain: Low Risk  (03/18/2024)   Received [facility]   Overall Financial Resource Strain (CARDIA)    How hard is it for you to pay for the very basics like food, housing, medical care, and heating?: Not hard at all  Food Insecurity: No Food Insecurity (03/18/2024)   Received from [facility]   Hunger Vital Sign    Within the past 12 months, you worried that your food would run out before you got the money to buy more.: Never true    Within the past 12 months, the food you bought just didn't last and you didn't have money to get more.: Never true  Transportation Needs: Unknown (03/18/2024)   Received from [facility] - Transportation    In the past 12 months, has lack of transportation kept you from medical appointments or from getting medications?: No    Lack of Transportation (Non-Medical): Not on file  Physical Activity: Sufficiently Active (02/16/2021)   [facility]   Exercise Vital Sign    On average, how many days per week do you engage in moderate to strenuous exercise (like a brisk walk)?: 5 days    On average, how many minutes do you engage in exercise at this level?: 30 min  Stress: Unknown (02/16/2021)   Received from [facility] - Occupational Stress Questionnaire    Feeling of Stress : Patient declined  Social Connections: Unknown (10/26/2021)   Received from [facility]   Social Network    Social Network: Not on file    Review of systems General: negative for malaise, night sweats, fever, chills, weight loss Neck: Negative for lumps, goiter, pain and significant neck swelling Resp: Negative for cough, wheezing, dyspnea at  rest CV: Negative for chest pain, leg swelling, palpitations, orthopnea GI: denies melena, hematochezia, nausea, vomiting, constipation, dysphagia, odyonophagia, early satiety or unintentional weight loss. +looser stools  MSK: Negative for joint pain or swelling, back pain, and muscle pain. Derm: Negative for itching or rash Psych: Denies depression, anxiety, memory loss, confusion. No homicidal or suicidal ideation.  Heme: Negative for prolonged bleeding, bruising easily, and swollen nodes. Endocrine: Negative for cold or heat intolerance, polyuria, polydipsia and goiter. Neuro: negative for tremor, gait imbalance, syncope and seizures. The remainder of the review of systems is noncontributory.  Physical Exam: BP 129/80   Pulse 94   Temp (!) 97.1 F (36.2 C)   Ht 5' 2 (1.575 m)   Wt 199 lb 3.2 oz (90.4 kg)  BMI 36.43 kg/m  General:   Alert and oriented. No distress noted. Pleasant and cooperative.  Head:  Normocephalic and atraumatic. Eyes:  Conjuctiva clear without scleral icterus. Mouth:  Oral mucosa pink and moist. Good dentition. No lesions. Heart: Normal rate and rhythm, s1 and s2 heart sounds present.  Lungs: Clear lung sounds in all lobes. Respirations equal and unlabored. Abdomen:  +BS, soft, non-tender and non-distended. No rebound or guarding. No HSM or masses noted. Derm: No palmar erythema or jaundice Msk:  Symmetrical without gross deformities. Normal posture. Extremities:  Without edema. Neurologic:  Alert and  oriented x4 Psych:  Alert and cooperative. Normal mood and affect.  Invalid input(s): 6 MONTHS   ASSESSMENT: Connie Jensen is a 44 y.o. female presenting today for follow up of LC  -initial onset diarrhea June 2023, upwards of 10-20 bowel movements per day. -She underwent colonoscopy which showed lymphocytic colitis and completed budesonide  taper with resolution of her symptoms.  -had recurrence of diarrhea again in July 2024, infectious etiology  was ruled out and she was restarted on budesonide  taper on 01/10/2023.   -had recurrence of diarrhea in November as she tapered down to 6 mg of budesonide , did have improvement in symptoms after she went back up to 9mg  daily.   -Started on cholestyramine  4 g daily in December due to ongoing looser stools--did not tolerate this.   - having 1-2 looser to softer stools previously but transitioned down to 3 mg per day and noted having upwards of 4-5 stools, looser that transition to watery atleast 3 times per week  At last visit we increased budesonide  back to 6mg  daily which seems to have helped mildly. Havign about 3 looser stools per day, will have flare of watery stools and more stooling a few times per month, noted worse when higher levels of stress. cannot pinpoint any specific food triggers. Notably she was started on sulfasalazine 500 mg twice daily by her rheumatologist due to uncontrolled joint pain from her arthritis which initially seemed to help her stooling but as she tapered down to 3mg  stooling increased.   There could be some underlying IBS contributing here, though I think this is less likely given she has no abdominal pain or obvious food triggers. Her symptoms are likely stemming from her LC.  As she had some improvement with budesonide  6mg , will leave her here as I am hesitant to increase back to 9mg  daily and she is still symptomatic, will trial 2 month course of bismuth 786mg  TID to see how she does with this.    PLAN:  -continue budesonide  6mg  daily -continue to avoid NSAIDs  -add bismuth 786mg  TID x2 months   All questions were answered, patient verbalized understanding and is in agreement with plan as outlined above.    Follow Up: 2 months   Donyell Ding L. Mariette, MSN, APRN, AGNP-C Adult-Gerontology Nurse Practitioner [facility]   I have reviewed the note and agree with the APP's assessment as described in this progress note  Toribio Fortune,  MD Gastroenterology and Hepatology [facility]

## 2024-03-26 NOTE — Patient Instructions (Addendum)
 Please continue budesonide  6mg  daily We will add bismuth to see if this helps, take three 262mg  tablets three times per day  Let me know if you have worsening of symptoms or intolerance to the bismuth Continue to avoid all NSAIDs (advil , aleve, naproxen, goody/BC powder, ibuprofen , motrin , voltaren/diclofenac)    Follow up 2 months  It was a pleasure to see you today. I want to create trusting relationships with patients and provide genuine, compassionate, and quality care. I truly value your feedback! please be on the lookout for a survey regarding your visit with me today. I appreciate your input about our visit and your time in completing this!    Georgi Tuel L. Loula Marcella, MSN, APRN, AGNP-C Adult-Gerontology Nurse Practitioner [facility] at [facility]

## 2024-03-27 ENCOUNTER — Encounter (INDEPENDENT_AMBULATORY_CARE_PROVIDER_SITE_OTHER): Payer: Self-pay | Admitting: Gastroenterology

## 2024-05-27 ENCOUNTER — Ambulatory Visit (INDEPENDENT_AMBULATORY_CARE_PROVIDER_SITE_OTHER): Admitting: Gastroenterology

## 2024-07-22 ENCOUNTER — Ambulatory Visit (INDEPENDENT_AMBULATORY_CARE_PROVIDER_SITE_OTHER): Admitting: Gastroenterology
